# Patient Record
Sex: Female | Born: 1945 | Race: White | Hispanic: No | Smoking: Former smoker
Health system: Southern US, Community
[De-identification: ages and names within clinical notes are randomized; demographics above are authoritative.]

## PROBLEM LIST (undated history)

## (undated) DIAGNOSIS — Z8619 Personal history of other infectious and parasitic diseases: Secondary | ICD-10-CM

## (undated) DIAGNOSIS — K5792 Diverticulitis of intestine, part unspecified, without perforation or abscess without bleeding: Secondary | ICD-10-CM

## (undated) DIAGNOSIS — K219 Gastro-esophageal reflux disease without esophagitis: Secondary | ICD-10-CM

## (undated) DIAGNOSIS — F419 Anxiety disorder, unspecified: Secondary | ICD-10-CM

## (undated) DIAGNOSIS — E785 Hyperlipidemia, unspecified: Secondary | ICD-10-CM

## (undated) DIAGNOSIS — D3000 Benign neoplasm of unspecified kidney: Secondary | ICD-10-CM

## (undated) DIAGNOSIS — F32A Depression, unspecified: Secondary | ICD-10-CM

## (undated) DIAGNOSIS — F329 Major depressive disorder, single episode, unspecified: Secondary | ICD-10-CM

## (undated) DIAGNOSIS — R011 Cardiac murmur, unspecified: Secondary | ICD-10-CM

## (undated) DIAGNOSIS — D649 Anemia, unspecified: Secondary | ICD-10-CM

## (undated) DIAGNOSIS — I10 Essential (primary) hypertension: Secondary | ICD-10-CM

## (undated) HISTORY — DX: Anemia, unspecified: D64.9

## (undated) HISTORY — DX: Essential (primary) hypertension: I10

## (undated) HISTORY — DX: Personal history of other infectious and parasitic diseases: Z86.19

## (undated) HISTORY — DX: Gastro-esophageal reflux disease without esophagitis: K21.9

## (undated) HISTORY — DX: Anxiety disorder, unspecified: F41.9

## (undated) HISTORY — DX: Diverticulitis of intestine, part unspecified, without perforation or abscess without bleeding: K57.92

## (undated) HISTORY — DX: Hyperlipidemia, unspecified: E78.5

## (undated) HISTORY — DX: Depression, unspecified: F32.A

## (undated) HISTORY — DX: Cardiac murmur, unspecified: R01.1

---
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
## 1990-11-12 HISTORY — PX: ABDOMINAL HYSTERECTOMY: SHX81

## 1998-11-12 HISTORY — PX: CHOLECYSTECTOMY: SHX55

## 2016-12-20 DIAGNOSIS — H353130 Nonexudative age-related macular degeneration, bilateral, stage unspecified: Secondary | ICD-10-CM | POA: Diagnosis not present

## 2016-12-20 DIAGNOSIS — H5213 Myopia, bilateral: Secondary | ICD-10-CM | POA: Diagnosis not present

## 2016-12-20 DIAGNOSIS — H2513 Age-related nuclear cataract, bilateral: Secondary | ICD-10-CM | POA: Diagnosis not present

## 2016-12-20 DIAGNOSIS — H524 Presbyopia: Secondary | ICD-10-CM | POA: Diagnosis not present

## 2017-01-04 DIAGNOSIS — I10 Essential (primary) hypertension: Secondary | ICD-10-CM | POA: Diagnosis not present

## 2017-01-04 DIAGNOSIS — D72829 Elevated white blood cell count, unspecified: Secondary | ICD-10-CM | POA: Diagnosis not present

## 2017-01-04 DIAGNOSIS — E7800 Pure hypercholesterolemia, unspecified: Secondary | ICD-10-CM | POA: Diagnosis not present

## 2017-01-04 LAB — HEMOGLOBIN A1C: Hemoglobin A1C: 5.8

## 2017-01-04 LAB — TSH: TSH: 1.62 (ref <=5.90)

## 2017-01-04 LAB — CBC AND DIFFERENTIAL: WBC: 11.4

## 2017-01-11 DIAGNOSIS — D72829 Elevated white blood cell count, unspecified: Secondary | ICD-10-CM | POA: Diagnosis not present

## 2017-01-11 DIAGNOSIS — Z1389 Encounter for screening for other disorder: Secondary | ICD-10-CM | POA: Diagnosis not present

## 2017-01-11 DIAGNOSIS — Z90710 Acquired absence of both cervix and uterus: Secondary | ICD-10-CM | POA: Diagnosis not present

## 2017-01-11 DIAGNOSIS — Z1211 Encounter for screening for malignant neoplasm of colon: Secondary | ICD-10-CM | POA: Diagnosis not present

## 2017-01-11 DIAGNOSIS — I10 Essential (primary) hypertension: Secondary | ICD-10-CM | POA: Diagnosis not present

## 2017-01-11 DIAGNOSIS — I6523 Occlusion and stenosis of bilateral carotid arteries: Secondary | ICD-10-CM | POA: Diagnosis not present

## 2017-01-11 DIAGNOSIS — F17290 Nicotine dependence, other tobacco product, uncomplicated: Secondary | ICD-10-CM | POA: Diagnosis not present

## 2017-01-11 DIAGNOSIS — Z1231 Encounter for screening mammogram for malignant neoplasm of breast: Secondary | ICD-10-CM | POA: Diagnosis not present

## 2017-01-11 DIAGNOSIS — E559 Vitamin D deficiency, unspecified: Secondary | ICD-10-CM | POA: Diagnosis not present

## 2017-01-11 DIAGNOSIS — M8589 Other specified disorders of bone density and structure, multiple sites: Secondary | ICD-10-CM | POA: Diagnosis not present

## 2017-01-11 DIAGNOSIS — E7800 Pure hypercholesterolemia, unspecified: Secondary | ICD-10-CM | POA: Diagnosis not present

## 2017-01-11 DIAGNOSIS — Z0000 Encounter for general adult medical examination without abnormal findings: Secondary | ICD-10-CM | POA: Diagnosis not present

## 2017-02-05 DIAGNOSIS — Z1211 Encounter for screening for malignant neoplasm of colon: Secondary | ICD-10-CM | POA: Diagnosis not present

## 2017-02-08 DIAGNOSIS — H353130 Nonexudative age-related macular degeneration, bilateral, stage unspecified: Secondary | ICD-10-CM | POA: Diagnosis not present

## 2017-02-08 DIAGNOSIS — H2513 Age-related nuclear cataract, bilateral: Secondary | ICD-10-CM | POA: Diagnosis not present

## 2017-04-15 DIAGNOSIS — Z1231 Encounter for screening mammogram for malignant neoplasm of breast: Secondary | ICD-10-CM | POA: Diagnosis not present

## 2017-04-15 LAB — HM MAMMOGRAPHY

## 2017-04-26 DIAGNOSIS — M545 Low back pain: Secondary | ICD-10-CM | POA: Diagnosis not present

## 2017-04-26 DIAGNOSIS — I10 Essential (primary) hypertension: Secondary | ICD-10-CM | POA: Diagnosis not present

## 2017-05-07 DIAGNOSIS — M545 Low back pain: Secondary | ICD-10-CM | POA: Diagnosis not present

## 2017-05-10 DIAGNOSIS — M47816 Spondylosis without myelopathy or radiculopathy, lumbar region: Secondary | ICD-10-CM | POA: Diagnosis not present

## 2017-05-21 DIAGNOSIS — M545 Low back pain: Secondary | ICD-10-CM | POA: Diagnosis not present

## 2017-06-04 DIAGNOSIS — M791 Myalgia: Secondary | ICD-10-CM | POA: Diagnosis not present

## 2017-06-06 DIAGNOSIS — M791 Myalgia: Secondary | ICD-10-CM | POA: Diagnosis not present

## 2017-06-11 DIAGNOSIS — M791 Myalgia: Secondary | ICD-10-CM | POA: Diagnosis not present

## 2017-06-13 DIAGNOSIS — M791 Myalgia: Secondary | ICD-10-CM | POA: Diagnosis not present

## 2017-06-18 DIAGNOSIS — M791 Myalgia: Secondary | ICD-10-CM | POA: Diagnosis not present

## 2017-06-20 DIAGNOSIS — M791 Myalgia: Secondary | ICD-10-CM | POA: Diagnosis not present

## 2017-06-25 DIAGNOSIS — M791 Myalgia: Secondary | ICD-10-CM | POA: Diagnosis not present

## 2017-06-27 DIAGNOSIS — M791 Myalgia: Secondary | ICD-10-CM | POA: Diagnosis not present

## 2017-07-02 DIAGNOSIS — M545 Low back pain: Secondary | ICD-10-CM | POA: Diagnosis not present

## 2017-07-02 DIAGNOSIS — M791 Myalgia: Secondary | ICD-10-CM | POA: Diagnosis not present

## 2017-07-19 DIAGNOSIS — E7800 Pure hypercholesterolemia, unspecified: Secondary | ICD-10-CM | POA: Diagnosis not present

## 2017-07-19 DIAGNOSIS — I10 Essential (primary) hypertension: Secondary | ICD-10-CM | POA: Diagnosis not present

## 2017-07-26 DIAGNOSIS — E559 Vitamin D deficiency, unspecified: Secondary | ICD-10-CM | POA: Diagnosis not present

## 2017-07-26 DIAGNOSIS — I10 Essential (primary) hypertension: Secondary | ICD-10-CM | POA: Diagnosis not present

## 2017-07-26 DIAGNOSIS — M549 Dorsalgia, unspecified: Secondary | ICD-10-CM | POA: Diagnosis not present

## 2017-07-26 DIAGNOSIS — I6523 Occlusion and stenosis of bilateral carotid arteries: Secondary | ICD-10-CM | POA: Diagnosis not present

## 2017-07-26 DIAGNOSIS — E7800 Pure hypercholesterolemia, unspecified: Secondary | ICD-10-CM | POA: Diagnosis not present

## 2017-07-26 DIAGNOSIS — F17200 Nicotine dependence, unspecified, uncomplicated: Secondary | ICD-10-CM | POA: Diagnosis not present

## 2017-07-26 DIAGNOSIS — M8589 Other specified disorders of bone density and structure, multiple sites: Secondary | ICD-10-CM | POA: Diagnosis not present

## 2017-07-26 DIAGNOSIS — D72829 Elevated white blood cell count, unspecified: Secondary | ICD-10-CM | POA: Diagnosis not present

## 2017-07-26 DIAGNOSIS — Z23 Encounter for immunization: Secondary | ICD-10-CM | POA: Diagnosis not present

## 2017-08-12 DIAGNOSIS — H43392 Other vitreous opacities, left eye: Secondary | ICD-10-CM | POA: Diagnosis not present

## 2017-08-12 DIAGNOSIS — H2513 Age-related nuclear cataract, bilateral: Secondary | ICD-10-CM | POA: Diagnosis not present

## 2017-08-12 DIAGNOSIS — H35363 Drusen (degenerative) of macula, bilateral: Secondary | ICD-10-CM | POA: Diagnosis not present

## 2017-08-12 DIAGNOSIS — H43811 Vitreous degeneration, right eye: Secondary | ICD-10-CM | POA: Diagnosis not present

## 2017-08-12 DIAGNOSIS — H33101 Unspecified retinoschisis, right eye: Secondary | ICD-10-CM | POA: Diagnosis not present

## 2017-08-12 DIAGNOSIS — H353130 Nonexudative age-related macular degeneration, bilateral, stage unspecified: Secondary | ICD-10-CM | POA: Diagnosis not present

## 2018-01-08 DIAGNOSIS — Z1159 Encounter for screening for other viral diseases: Secondary | ICD-10-CM | POA: Diagnosis not present

## 2018-01-08 DIAGNOSIS — D649 Anemia, unspecified: Secondary | ICD-10-CM | POA: Diagnosis not present

## 2018-01-08 DIAGNOSIS — E7800 Pure hypercholesterolemia, unspecified: Secondary | ICD-10-CM | POA: Diagnosis not present

## 2018-01-08 DIAGNOSIS — R7301 Impaired fasting glucose: Secondary | ICD-10-CM | POA: Diagnosis not present

## 2018-01-08 DIAGNOSIS — I10 Essential (primary) hypertension: Secondary | ICD-10-CM | POA: Diagnosis not present

## 2018-01-08 LAB — IRON,TIBC AND FERRITIN PANEL
Ferritin: 146.1
Iron: 96
TIBC: 327

## 2018-01-08 LAB — TSH: TSH: 1.99 (ref <=5.90)

## 2018-01-08 LAB — CBC AND DIFFERENTIAL
HEMATOCRIT: 39 (ref 36–46)
Hemoglobin: 12.9 (ref 12.0–16.0)
PLATELETS: 284 (ref 150–399)
WBC: 9.7

## 2018-01-08 LAB — BASIC METABOLIC PANEL
GLUCOSE: 102
POTASSIUM: 4.8 (ref 3.4–5.3)
Sodium: 141 (ref 137–147)

## 2018-01-08 LAB — LIPID PANEL
CHOLESTEROL: 171 (ref 0–200)
HDL: 40 (ref 35–70)
LDL Cholesterol: 109
TRIGLYCERIDES: 111 (ref 40–160)

## 2018-01-08 LAB — VITAMIN B12: Vitamin B-12: 962

## 2018-01-08 LAB — MICROALBUMIN, URINE: Microalb, Ur: <1.2

## 2018-01-08 LAB — HEPATIC FUNCTION PANEL: Alkaline Phosphatase: 207 — AB (ref 25–125)

## 2018-01-15 DIAGNOSIS — R8290 Unspecified abnormal findings in urine: Secondary | ICD-10-CM | POA: Diagnosis not present

## 2018-01-15 DIAGNOSIS — M8589 Other specified disorders of bone density and structure, multiple sites: Secondary | ICD-10-CM | POA: Diagnosis not present

## 2018-01-15 DIAGNOSIS — D72829 Elevated white blood cell count, unspecified: Secondary | ICD-10-CM | POA: Diagnosis not present

## 2018-01-15 DIAGNOSIS — I10 Essential (primary) hypertension: Secondary | ICD-10-CM | POA: Diagnosis not present

## 2018-01-15 DIAGNOSIS — Z0000 Encounter for general adult medical examination without abnormal findings: Secondary | ICD-10-CM | POA: Diagnosis not present

## 2018-01-15 DIAGNOSIS — E7800 Pure hypercholesterolemia, unspecified: Secondary | ICD-10-CM | POA: Diagnosis not present

## 2018-01-15 DIAGNOSIS — F17200 Nicotine dependence, unspecified, uncomplicated: Secondary | ICD-10-CM | POA: Diagnosis not present

## 2018-01-15 DIAGNOSIS — Z90710 Acquired absence of both cervix and uterus: Secondary | ICD-10-CM | POA: Diagnosis not present

## 2018-01-15 DIAGNOSIS — Z1231 Encounter for screening mammogram for malignant neoplasm of breast: Secondary | ICD-10-CM | POA: Diagnosis not present

## 2018-01-15 DIAGNOSIS — E559 Vitamin D deficiency, unspecified: Secondary | ICD-10-CM | POA: Diagnosis not present

## 2018-01-15 DIAGNOSIS — I6523 Occlusion and stenosis of bilateral carotid arteries: Secondary | ICD-10-CM | POA: Diagnosis not present

## 2018-01-15 LAB — MICROALBUMIN, URINE: Microalb, Ur: 10.2

## 2018-02-12 DIAGNOSIS — R8290 Unspecified abnormal findings in urine: Secondary | ICD-10-CM | POA: Diagnosis not present

## 2018-02-17 DIAGNOSIS — H43811 Vitreous degeneration, right eye: Secondary | ICD-10-CM | POA: Diagnosis not present

## 2018-02-17 DIAGNOSIS — H353130 Nonexudative age-related macular degeneration, bilateral, stage unspecified: Secondary | ICD-10-CM | POA: Diagnosis not present

## 2018-02-17 DIAGNOSIS — H43392 Other vitreous opacities, left eye: Secondary | ICD-10-CM | POA: Diagnosis not present

## 2018-02-17 DIAGNOSIS — H2513 Age-related nuclear cataract, bilateral: Secondary | ICD-10-CM | POA: Diagnosis not present

## 2018-05-19 DIAGNOSIS — I10 Essential (primary) hypertension: Secondary | ICD-10-CM | POA: Diagnosis not present

## 2018-05-19 DIAGNOSIS — E785 Hyperlipidemia, unspecified: Secondary | ICD-10-CM | POA: Diagnosis not present

## 2018-07-18 DIAGNOSIS — E7800 Pure hypercholesterolemia, unspecified: Secondary | ICD-10-CM | POA: Diagnosis not present

## 2018-07-25 DIAGNOSIS — Z23 Encounter for immunization: Secondary | ICD-10-CM | POA: Diagnosis not present

## 2018-07-25 DIAGNOSIS — E559 Vitamin D deficiency, unspecified: Secondary | ICD-10-CM | POA: Diagnosis not present

## 2018-07-25 DIAGNOSIS — F419 Anxiety disorder, unspecified: Secondary | ICD-10-CM | POA: Diagnosis not present

## 2018-07-25 DIAGNOSIS — K219 Gastro-esophageal reflux disease without esophagitis: Secondary | ICD-10-CM | POA: Diagnosis not present

## 2018-07-25 DIAGNOSIS — M8589 Other specified disorders of bone density and structure, multiple sites: Secondary | ICD-10-CM | POA: Diagnosis not present

## 2018-07-25 DIAGNOSIS — I6523 Occlusion and stenosis of bilateral carotid arteries: Secondary | ICD-10-CM | POA: Diagnosis not present

## 2018-07-25 DIAGNOSIS — I10 Essential (primary) hypertension: Secondary | ICD-10-CM | POA: Diagnosis not present

## 2018-07-25 DIAGNOSIS — F17200 Nicotine dependence, unspecified, uncomplicated: Secondary | ICD-10-CM | POA: Diagnosis not present

## 2018-07-25 DIAGNOSIS — E786 Lipoprotein deficiency: Secondary | ICD-10-CM | POA: Diagnosis not present

## 2018-07-25 DIAGNOSIS — R899 Unspecified abnormal finding in specimens from other organs, systems and tissues: Secondary | ICD-10-CM | POA: Diagnosis not present

## 2018-07-25 DIAGNOSIS — E7800 Pure hypercholesterolemia, unspecified: Secondary | ICD-10-CM | POA: Diagnosis not present

## 2018-07-25 DIAGNOSIS — Z90710 Acquired absence of both cervix and uterus: Secondary | ICD-10-CM | POA: Diagnosis not present

## 2018-07-25 DIAGNOSIS — F4321 Adjustment disorder with depressed mood: Secondary | ICD-10-CM | POA: Diagnosis not present

## 2018-09-03 ENCOUNTER — Encounter: Payer: Self-pay | Admitting: Family Medicine

## 2018-09-03 ENCOUNTER — Ambulatory Visit (INDEPENDENT_AMBULATORY_CARE_PROVIDER_SITE_OTHER): Payer: Medicare Other | Admitting: Family Medicine

## 2018-09-03 ENCOUNTER — Other Ambulatory Visit: Payer: Self-pay

## 2018-09-03 VITALS — BP 136/78 | HR 78 | Temp 97.8°F | Ht 59.0 in | Wt 128.6 lb

## 2018-09-03 DIAGNOSIS — R011 Cardiac murmur, unspecified: Secondary | ICD-10-CM | POA: Insufficient documentation

## 2018-09-03 DIAGNOSIS — R748 Abnormal levels of other serum enzymes: Secondary | ICD-10-CM | POA: Diagnosis not present

## 2018-09-03 DIAGNOSIS — E782 Mixed hyperlipidemia: Secondary | ICD-10-CM

## 2018-09-03 DIAGNOSIS — I10 Essential (primary) hypertension: Secondary | ICD-10-CM | POA: Diagnosis not present

## 2018-09-03 DIAGNOSIS — K219 Gastro-esophageal reflux disease without esophagitis: Secondary | ICD-10-CM | POA: Insufficient documentation

## 2018-09-03 DIAGNOSIS — Z0000 Encounter for general adult medical examination without abnormal findings: Secondary | ICD-10-CM

## 2018-09-03 DIAGNOSIS — E785 Hyperlipidemia, unspecified: Secondary | ICD-10-CM | POA: Insufficient documentation

## 2018-09-03 NOTE — Patient Instructions (Signed)
Come back for labs only in December.

## 2018-09-03 NOTE — Progress Notes (Signed)
Phone: [phone]  Subjective:  Patient presents today for their Medicare Exam, establish care and follow up of her medical problems.   1) Hypertension: Here for follow up of hypertension.  Currently on lisinopril 30mg  .  Does not take home readings. Takes medication as prescribed and denies any side effects. Exercise includes walking. Weight has been stable. Denies any chest pain, headaches, shortness of breath, vision changes, swelling in lower extremities. She had labs done in September at her previous doctor's office and reviewed (cmp/lipid). Does not need refill of her medication.   2) hyperlipidemia: currently on 10mg  of crestor. She has no history of CAD/heart disease. She was on pravastatin previously. Her father had a MI at age 50 years. Mom did not have any heart issues. She has no complaints. She could not tolerate the 20mg  of the crestor, so is down to 10mg . No refills needed at this time. Lipid panel just done 07/2018 and to goal except HDL of 39.   3) GERD: currently on 40mg  of omeprazole. She has been on this for a long time. Symptoms are well controlled on it.   Preventive Screening-Counseling & Management  No exam data present  Advanced directives: No  Smoking Status: Current every day smoker- 1/2 PPD.  Second Hand Smoking status: (+) smokers in home  Risk Factors Regular exercise: walking Diet: Not good Fall Risk: None Fall Risk  09/03/2018  Falls in the past year? No   Opioid use history: no long term opioids use  Cardiac risk factors:  advanced age (older than 3 for men, 9 for women) Hyperlipidemia yes No diabetes. no Family History: yes, in her father but at age 71 years.   Depression Screen None. PHQ2 0  Depression screen [facility] 2/9 09/03/2018 09/03/2018  Decreased Interest 0 0  Down, Depressed, Hopeless 0 0  PHQ - 2 Score 0 0  Altered sleeping 0 -  Tired, decreased energy 0 -  Change in appetite 0 -  Feeling bad or failure about yourself  0 -    Trouble concentrating 0 -  Moving slowly or fidgety/restless 0 -  Suicidal thoughts 0 -  PHQ-9 Score 0 -  Difficult doing work/chores Not difficult at all -    Activities of Daily Living Independent ADLs and IADLs   Hearing Difficulties: -patient declines  Cognitive Testing No reported trouble.    Normal 3 word recall Mini-cog: 5/5  List the Names of Other Physician/Practitioners you currently use: - -   There is no immunization history on file for this patient. Required Immunizations needed today already had flu shot. Will wait for records.   Screening tests- up to date Health Maintenance Due  Topic Date Due  . Hepatitis C Screening  [birth year]  . TETANUS/TDAP  06/28/[birth year]  . DEXA SCAN  06/28/[birth year]  . PNA vac Low Risk Adult (1 of 2 - PCV13) 06/28/71     Review of Systems  Constitutional: Negative for chills, fever and malaise/fatigue.  HENT: Negative for hearing loss and sore throat.   Eyes: Negative for blurred vision and double vision.  Respiratory: Negative for cough, shortness of breath and wheezing.   Cardiovascular: Negative for chest pain, palpitations and leg swelling.  Gastrointestinal: Negative for abdominal pain, blood in stool, nausea and vomiting.  Genitourinary: Negative for dysuria and hematuria.  Musculoskeletal: Negative for falls.  Skin: Negative for rash.  Neurological: Negative for dizziness and weakness.  Psychiatric/Behavioral: Negative for memory loss and suicidal ideas. The patient is not nervous/anxious and does  not have insomnia.      The following were reviewed and entered/updated in epic: Past Medical History:  Diagnosis Date  . Anxiety   . Diverticulitis   . GERD (gastroesophageal reflux disease)   . Heart murmur   . History of chicken pox   . Hyperlipidemia   . Hypertension    Patient Active Problem List   Diagnosis Date Noted  . Benign essential HTN 09/03/2018  . GERD (gastroesophageal reflux disease)  09/03/2018  . Hyperlipidemia 09/03/2018  . Heart murmur 09/03/2018   Past Surgical History:  Procedure Laterality Date  . ABDOMINAL HYSTERECTOMY  1992  . APPENDECTOMY  1961  . CHOLECYSTECTOMY  2000  . TONSILLECTOMY AND ADENOIDECTOMY  1959    Family History  Problem Relation Age of Onset  . COPD Mother   . Heart attack Father   . Stroke Brother     Medications- reviewed and updated Current Outpatient Medications  Medication Sig Dispense Refill  . aspirin EC 81 MG tablet Take 81 mg by mouth daily.    . rosuvastatin (CRESTOR) 10 MG tablet Take 10 mg by mouth daily.    Marland Kitchen ALPRAZolam (XANAX) 0.25 MG tablet     . lisinopril (PRINIVIL,ZESTRIL) 30 MG tablet     . omeprazole (PRILOSEC) 40 MG capsule      No current facility-administered medications for this visit.     Allergies-reviewed and updated Allergies  Allergen Reactions  . Atorvastatin Hives  . Lescol [Fluvastatin] Hives    Social History   Socioeconomic History  . Marital status: Widowed    Spouse name: Not on file  . Number of children: Not on file  . Years of education: Not on file  . Highest education level: Not on file  Occupational History  . Not on file  Social Needs  . Financial resource strain: Not on file  . Food insecurity:    Worry: Not on file    Inability: Not on file  . Transportation needs:    Medical: Not on file    Non-medical: Not on file  Tobacco Use  . Smoking status: Current Every Day Smoker    Types: Cigarettes  . Smokeless tobacco: Never Used  Substance and Sexual Activity  . Alcohol use: Not Currently  . Drug use: Never  . Sexual activity: Not Currently  Lifestyle  . Physical activity:    Days per week: Not on file    Minutes per session: Not on file  . Stress: Not on file  Relationships  . Social connections:    Talks on phone: Not on file    Gets together: Not on file    Attends religious service: Not on file    Active member of club or organization: Not on file     Attends meetings of clubs or organizations: Not on file    Relationship status: Not on file  Other Topics Concern  . Not on file  Social History Narrative  . Not on file    Objective: BP 136/78 (BP Location: Left Arm, Patient Position: Sitting, Cuff Size: Normal)   Pulse 78   Temp 97.8 F (36.6 C) (Oral)   Ht 4\' 11"  (1.499 m)   Wt 128 lb 9.6 oz (58.3 kg)   LMP  (LMP Unknown)   SpO2 96%   BMI 25.97 kg/m  Gen: NAD, resting comfortably HEENT: Mucous membranes are moist. Oropharynx normal Neck: no thyromegaly CV: RRR no murmurs rubs or gallops Lungs: CTAB no crackles, wheeze, rhonchi Abdomen:  soft/nontender/nondistended/normal bowel sounds. No rebound or guarding.  Ext: no edema Skin: warm, dry Neuro: grossly normal, moves all extremities, PERRLA  Assessment/Plan: Medicare exam completed- discussed recommended screenings anddocumented any personalized health advice and referrals for preventive counseling. See AVS as well which was given to patient.   Status of chronic or acute concerns    Future Appointments  Date Time Provider [place]  10/13/2018 10:45 AM [facility]  03/02/2019 11:00 AM Orma Flaming, MD [facility]   Return in about 6 months (around 03/05/2019) for regular check up .   Lab/Order associations: Medicare annual wellness visit, subsequent  Benign essential HTN -Blood pressure is to goal. Continue current anti-hypertensive medications. No Refills needed. Recommended routine exercise and healthy diet including DASH diet and mediterranean diet. Encouraged weight loss. F/u in 6 months. Reviewed labs and requesting records as well.   Gastroesophageal reflux disease, esophagitis presence not specified -well controlled on her medication. No refills needed. Has been on this a long time.   -hyperlipidemia Well controlled on crestor 10mg . Labs reviewed. Continue medication. No refills needed at this time. Will repeat lipid panel in 07/2019.    Elevated alkaline phosphatase level - Plan: Comprehensive metabolic panel, VITAMIN D 25 Hydroxy (Vit-D Deficiency, Fractures), CBC with Differential/Platelet, Gamma GT -will come back for labs. Future ordered these.   No orders of the defined types were placed in this encounter.  -requesting records for her health maintenance/previous notes. -mmg info given.   Return precautions advised. Orma Flaming, MD

## 2018-09-11 LAB — CHG X-RAY LUMBAR SPINE 2/3 VW

## 2018-09-11 LAB — MR OUTSIDE FILMS SPINE

## 2018-09-11 LAB — FOLATE: .: >20

## 2018-09-25 ENCOUNTER — Other Ambulatory Visit: Payer: Self-pay

## 2018-09-25 ENCOUNTER — Other Ambulatory Visit: Payer: Self-pay | Admitting: Family Medicine

## 2018-09-25 DIAGNOSIS — Z1231 Encounter for screening mammogram for malignant neoplasm of breast: Secondary | ICD-10-CM

## 2018-09-29 ENCOUNTER — Ambulatory Visit
Admission: RE | Admit: 2018-09-29 | Discharge: 2018-09-29 | Disposition: A | Discharge status: Home / Self Care | Payer: Medicare Other | Source: Ambulatory Visit | Attending: Family Medicine | Admitting: Family Medicine

## 2018-09-29 DIAGNOSIS — Z1231 Encounter for screening mammogram for malignant neoplasm of breast: Secondary | ICD-10-CM | POA: Diagnosis not present

## 2018-10-03 ENCOUNTER — Encounter: Payer: Self-pay | Admitting: Family Medicine

## 2018-10-03 DIAGNOSIS — E538 Deficiency of other specified B group vitamins: Secondary | ICD-10-CM | POA: Insufficient documentation

## 2018-10-03 DIAGNOSIS — M8580 Other specified disorders of bone density and structure, unspecified site: Secondary | ICD-10-CM | POA: Insufficient documentation

## 2018-10-03 DIAGNOSIS — Z862 Personal history of diseases of the blood and blood-forming organs and certain disorders involving the immune mechanism: Secondary | ICD-10-CM | POA: Insufficient documentation

## 2018-10-03 DIAGNOSIS — D649 Anemia, unspecified: Secondary | ICD-10-CM | POA: Insufficient documentation

## 2018-10-03 DIAGNOSIS — E559 Vitamin D deficiency, unspecified: Secondary | ICD-10-CM | POA: Insufficient documentation

## 2018-10-13 ENCOUNTER — Telehealth: Payer: Self-pay | Admitting: Family Medicine

## 2018-10-13 ENCOUNTER — Other Ambulatory Visit (INDEPENDENT_AMBULATORY_CARE_PROVIDER_SITE_OTHER): Payer: Medicare Other

## 2018-10-13 ENCOUNTER — Other Ambulatory Visit: Payer: Self-pay

## 2018-10-13 ENCOUNTER — Other Ambulatory Visit: Payer: Self-pay | Admitting: Family Medicine

## 2018-10-13 DIAGNOSIS — R748 Abnormal levels of other serum enzymes: Secondary | ICD-10-CM

## 2018-10-13 LAB — COMPREHENSIVE METABOLIC PANEL
ALK PHOS: 198 U/L — AB (ref 39–117)
ALT: 16 U/L (ref 0–35)
AST: 16 U/L (ref 0–37)
Albumin: 4.5 g/dL (ref 3.5–5.2)
BUN: 24 mg/dL — ABNORMAL HIGH (ref 6–23)
CO2: 23 mEq/L (ref 19–32)
Calcium: 9.9 mg/dL (ref 8.4–10.5)
Chloride: 105 mEq/L (ref 96–112)
Creatinine, Ser: 1.03 mg/dL (ref 0.40–1.20)
GFR: 55.94 mL/min — ABNORMAL LOW (ref >60.00)
Glucose, Bld: 114 mg/dL — ABNORMAL HIGH (ref 70–99)
Potassium: 4.3 mEq/L (ref 3.5–5.1)
Sodium: 139 mEq/L (ref 135–145)
Total Bilirubin: 0.4 mg/dL (ref 0.2–1.2)
Total Protein: 6.7 g/dL (ref 6.0–8.3)

## 2018-10-13 LAB — CBC WITH DIFFERENTIAL/PLATELET
Basophils Absolute: 0.1 10*3/uL (ref 0.0–0.1)
Basophils Relative: 0.6 % (ref 0.0–3.0)
Eosinophils Absolute: 0.3 10*3/uL (ref 0.0–0.7)
Eosinophils Relative: 2.7 % (ref 0.0–5.0)
HCT: 33.9 % — ABNORMAL LOW (ref 36.0–46.0)
HEMOGLOBIN: 11.5 g/dL — AB (ref 12.0–15.0)
Lymphocytes Relative: 49.1 % — ABNORMAL HIGH (ref 12.0–46.0)
Lymphs Abs: 5 10*3/uL — ABNORMAL HIGH (ref 0.7–4.0)
MCHC: 34 g/dL (ref 30.0–36.0)
MCV: 89.5 fl (ref 78.0–100.0)
MONO ABS: 0.6 10*3/uL (ref 0.1–1.0)
Monocytes Relative: 5.9 % (ref 3.0–12.0)
Neutro Abs: 4.2 10*3/uL (ref 1.4–7.7)
Neutrophils Relative %: 41.7 % — ABNORMAL LOW (ref 43.0–77.0)
Platelets: 320 10*3/uL (ref 150.0–400.0)
RBC: 3.79 Mil/uL — ABNORMAL LOW (ref 3.87–5.11)
RDW: 13.6 % (ref 11.5–15.5)
WBC: 10.1 10*3/uL (ref 4.0–10.5)

## 2018-10-13 LAB — GAMMA GT: GGT: 20 U/L (ref 7–51)

## 2018-10-13 LAB — VITAMIN D 25 HYDROXY (VIT D DEFICIENCY, FRACTURES): VITD: 38.06 ng/mL (ref 30.00–100.00)

## 2018-10-13 MED ORDER — LISINOPRIL 30 MG PO TABS: 30.0000 mg | ORAL_TABLET | Freq: Every day | ORAL | 1 refills | Status: DC

## 2018-10-13 NOTE — Addendum Note (Signed)
Addended by: Kayren Eaves T on: 10/13/2018 12:23 PM   Modules accepted: Orders

## 2018-10-13 NOTE — Telephone Encounter (Signed)
MEDICATION:  lisinopril (PRINIVIL,ZESTRIL) 30 MG tablet    omeprazole (PRILOSEC) 40 MG capsule    PHARMACY:   [id number] - [place], [place] - [address] [phone] (Phone) [phone] (Fax)     IS THIS A 90 DAY SUPPLY : yes  IS PATIENT OUT OF MEDICATION: no  IF NOT; HOW MUCH IS LEFT: a week  LAST APPOINTMENT DATE: @10 /31/2019  NEXT APPOINTMENT DATE:@12 /12/2017  OTHER COMMENTS:    **Let patient know to contact pharmacy at the end of the day to make sure medication is ready. **  ** Please notify patient to allow 48-72 hours to process**  **Encourage patient to contact the pharmacy for refills or they can request refills through [facility]**

## 2018-10-15 ENCOUNTER — Encounter: Payer: Self-pay | Admitting: Family Medicine

## 2018-10-15 DIAGNOSIS — R748 Abnormal levels of other serum enzymes: Secondary | ICD-10-CM | POA: Insufficient documentation

## 2019-02-26 ENCOUNTER — Other Ambulatory Visit: Payer: Self-pay

## 2019-02-26 ENCOUNTER — Telehealth: Payer: Self-pay | Admitting: Family Medicine

## 2019-02-26 ENCOUNTER — Ambulatory Visit (INDEPENDENT_AMBULATORY_CARE_PROVIDER_SITE_OTHER): Payer: Medicare Other | Admitting: Family Medicine

## 2019-02-26 ENCOUNTER — Encounter: Payer: Self-pay | Admitting: Family Medicine

## 2019-02-26 VITALS — Temp 97.0°F | Ht 59.0 in | Wt 132.0 lb

## 2019-02-26 DIAGNOSIS — R748 Abnormal levels of other serum enzymes: Secondary | ICD-10-CM | POA: Diagnosis not present

## 2019-02-26 DIAGNOSIS — F418 Other specified anxiety disorders: Secondary | ICD-10-CM

## 2019-02-26 DIAGNOSIS — E782 Mixed hyperlipidemia: Secondary | ICD-10-CM | POA: Diagnosis not present

## 2019-02-26 MED ORDER — PRAVASTATIN SODIUM 40 MG PO TABS: 40.0000 mg | ORAL_TABLET | Freq: Every day | ORAL | 3 refills | Status: DC

## 2019-02-26 NOTE — Telephone Encounter (Signed)
See note

## 2019-02-26 NOTE — Telephone Encounter (Signed)
Doxy.me visit scheduled for 4/16

## 2019-02-26 NOTE — Progress Notes (Signed)
Patient: Latoya Mcfarland MRN: [id number] DOB: [birth year] PCP: Orma Flaming, MD     I connected with Latoya Mcfarland on 02/26/19 at 9:10am by a video enabled telemedicine application and verified that I am speaking with the correct person using two identifiers.  Location patient: Home Location provider: [facility], Office Persons participating in this virtual visit: Latoya Mcfarland and Dr. Rogers Blocker   I discussed the limitations of evaluation and management by telemedicine and the availability of in person appointments. The patient expressed understanding and agreed to proceed.   Subjective:  Chief Complaint  Patient presents with  . Medication Management    HPI: The patient is a 73 y.o. female who presents today for concerns for her crestor and her elevated alk phos level. She states when she was in [place] her doctor changed her from pravachol to crestor to get better control of her lipids and she believes that is when her alkaline phosphatase "sky rocketed." She asked them about this and they told her to cut her dose in half which she did. She then moved down here and after we checked her labs her alkaline phosphatase continued to be mildly elevated at 198. I did check a GGT and vitamin D which were both normal.   She is having no side effects from the statin. Taking as prescribed.   She is also having increased anxiety due to her son being an ER doctor on the front lines, but feels like she is managing just fine. Does take xanax about twice/week but for sleep. Has never taken for anxiety and doesn't feel like she needs any medication.   Review of Systems  Constitutional: Negative for fatigue.  Eyes: Negative for visual disturbance.  Respiratory: Negative for shortness of breath.   Cardiovascular: Negative.  Negative for chest pain.  Gastrointestinal: Negative for abdominal pain and nausea.  Musculoskeletal: Negative for back pain and neck pain.  Neurological: Negative for  dizziness and facial asymmetry.  Psychiatric/Behavioral: Positive for dysphoric mood and sleep disturbance. The patient is nervous/anxious.     Allergies Patient is allergic to atorvastatin and lescol [fluvastatin].  Past Medical History Patient  has a past medical history of Anxiety, Diverticulitis, GERD (gastroesophageal reflux disease), Heart murmur, History of chicken pox, Hyperlipidemia, and Hypertension.  Surgical History Patient  has a past surgical history that includes Cholecystectomy (2000); Appendectomy (1961); Abdominal hysterectomy (1992); and Tonsillectomy and adenoidectomy (1959).  Family History Pateint's family history includes COPD in her mother; Heart attack in her father; Stroke in her brother.  Social History Patient  reports that she has been smoking cigarettes. She has never used smokeless tobacco. She reports previous alcohol use. She reports that she does not use drugs.    Objective: Vitals:   02/26/19 0900  Temp: (!) 97 F (36.1 C)  TempSrc: Oral  Weight: 132 lb (59.9 kg)  Height: 4' 11"  (1.499 m)    Body mass index is 26.66 kg/m.  Physical Exam Vitals signs reviewed.  Constitutional:      Appearance: Normal appearance.  Eyes:     Extraocular Movements: Extraocular movements intact.  Pulmonary:     Effort: Pulmonary effort is normal.  Skin:    General: Skin is warm.  Neurological:     General: No focal deficit present.     Mental Status: She is alert and oriented to person, place, and time.  Psychiatric:        Mood and Affect: Mood normal.        Behavior: Behavior normal.  Office Visit from 02/26/2019 in [place]  PHQ-9 Total Score  2     GAD 7 : Generalized Anxiety Score 02/26/2019 09/03/2018  Nervous, Anxious, on Edge 1 1  Control/stop worrying 1 0  Worry too much - different things 1 0  Trouble relaxing 0 0  Restless 0 0  Easily annoyed or irritable 0 0  Afraid - awful might happen 1 1   Total GAD 7 Score 4 2  Anxiety Difficulty Not difficult at all Not difficult at all      Assessment/plan: 1. Mixed hyperlipidemia Discussed I do not think the crestor is the cause of her elevated alkaline phosphatase, especially with a normal GGT, but I could be wrong and I am fine letting her stop this and go back to her 43m of pravachol. She tolerated this fine in the past. She is due for her 6 month f/u and labs in about 5 weeks so we will recheck lipids and cmp to see if levels are improved. She is happy with this plan. Did discuss that crestor is the ideal statin as it has better lipid lowering effects on lower doses, but if this is the cause of the elevated AP, Im fine with changing her.   2. Elevated alkaline phosphatase level See above.   3. Situational anxiety -GAD7 score and phq9 score are both insignificant. Secondary to covid outbreak. Managing just fine. Continue with conservative therapy, exercise. Let me know if she feels like this is getting worse.   Return in about 5 weeks (around 04/04/2019) for routine 6 month f/u and labs .    AOrma Flaming MD [place] 02/26/2019

## 2019-02-26 NOTE — Telephone Encounter (Signed)
I would schedule doxy visit. I don't think her crestor is causing the elevation and we can talk through this.Marland Kitchen

## 2019-02-26 NOTE — Telephone Encounter (Signed)
Copied from [place] [id number]. Topic: Quick Communication - Rx Refill/Question >> Feb 26, 2019  8:17 AM Margot Ables wrote: Medication: rosuvastatin (CRESTOR) 10 MG tablet - pt asking to switch medications. She states she was on pravastatin 40mg  before. Pt said she has had high alkaline phosphates and thinks that became higher after she changed to the rosuvastatin. She said Dr. Rogers Blocker may not have the records from her previous provider in [place] (who changed her to rosuvastatin). Pt notes that at last OV she was not fasting and was not sick (see lab notes from [place] 10/2018). Pt states she has stopped taking rosuvastatin a couple of weeks ago. Please call back [phone].  Preferred Pharmacy (with phone number or street name): [facility] [place], [place] [phone] (Phone) [phone] (Fax)

## 2019-03-02 ENCOUNTER — Ambulatory Visit: Payer: Medicare Other | Admitting: Family Medicine

## 2019-04-08 ENCOUNTER — Ambulatory Visit (INDEPENDENT_AMBULATORY_CARE_PROVIDER_SITE_OTHER): Payer: Medicare Other | Admitting: Family Medicine

## 2019-04-08 ENCOUNTER — Encounter: Payer: Self-pay | Admitting: Family Medicine

## 2019-04-08 ENCOUNTER — Other Ambulatory Visit: Payer: Self-pay

## 2019-04-08 VITALS — BP 132/66 | HR 77 | Temp 98.2°F | Ht 59.0 in | Wt 132.8 lb

## 2019-04-08 DIAGNOSIS — Z1211 Encounter for screening for malignant neoplasm of colon: Secondary | ICD-10-CM

## 2019-04-08 DIAGNOSIS — E782 Mixed hyperlipidemia: Secondary | ICD-10-CM

## 2019-04-08 DIAGNOSIS — R748 Abnormal levels of other serum enzymes: Secondary | ICD-10-CM | POA: Diagnosis not present

## 2019-04-08 DIAGNOSIS — I10 Essential (primary) hypertension: Secondary | ICD-10-CM | POA: Diagnosis not present

## 2019-04-08 LAB — CBC WITH DIFFERENTIAL/PLATELET
Basophils Absolute: 0.1 10*3/uL (ref 0.0–0.1)
Basophils Relative: 1 % (ref 0.0–3.0)
Eosinophils Absolute: 0.3 10*3/uL (ref 0.0–0.7)
Eosinophils Relative: 3 % (ref 0.0–5.0)
HCT: 32.1 % — ABNORMAL LOW (ref 36.0–46.0)
Hemoglobin: 10.9 g/dL — ABNORMAL LOW (ref 12.0–15.0)
Lymphocytes Relative: 42.3 % (ref 12.0–46.0)
Lymphs Abs: 4.4 10*3/uL — ABNORMAL HIGH (ref 0.7–4.0)
MCHC: 34.1 g/dL (ref 30.0–36.0)
MCV: 87.7 fl (ref 78.0–100.0)
Monocytes Absolute: 0.5 10*3/uL (ref 0.1–1.0)
Monocytes Relative: 4.5 % (ref 3.0–12.0)
Neutro Abs: 5.1 10*3/uL (ref 1.4–7.7)
Neutrophils Relative %: 49.2 % (ref 43.0–77.0)
Platelets: 344 10*3/uL (ref 150.0–400.0)
RBC: 3.66 Mil/uL — ABNORMAL LOW (ref 3.87–5.11)
RDW: 13.5 % (ref 11.5–15.5)
WBC: 10.4 10*3/uL (ref 4.0–10.5)

## 2019-04-08 LAB — COMPREHENSIVE METABOLIC PANEL
ALT: 14 U/L (ref 0–35)
AST: 17 U/L (ref 0–37)
Albumin: 4.3 g/dL (ref 3.5–5.2)
Alkaline Phosphatase: 225 U/L — ABNORMAL HIGH (ref 39–117)
BUN: 24 mg/dL — ABNORMAL HIGH (ref 6–23)
CO2: 25 mEq/L (ref 19–32)
Calcium: 9.9 mg/dL (ref 8.4–10.5)
Chloride: 102 mEq/L (ref 96–112)
Creatinine, Ser: 1.02 mg/dL (ref 0.40–1.20)
GFR: 53.15 mL/min — ABNORMAL LOW (ref >60.00)
Glucose, Bld: 103 mg/dL — ABNORMAL HIGH (ref 70–99)
Potassium: 5.3 mEq/L — ABNORMAL HIGH (ref 3.5–5.1)
Sodium: 137 mEq/L (ref 135–145)
Total Bilirubin: 0.5 mg/dL (ref 0.2–1.2)
Total Protein: 7.1 g/dL (ref 6.0–8.3)

## 2019-04-08 LAB — LIPID PANEL
Cholesterol: 176 mg/dL (ref 0–200)
HDL: 33.3 mg/dL — ABNORMAL LOW (ref >39.00)
NonHDL: 142.85
Total CHOL/HDL Ratio: 5
Triglycerides: 211 mg/dL — ABNORMAL HIGH (ref 0.0–149.0)
VLDL: 42.2 mg/dL — ABNORMAL HIGH (ref 0.0–40.0)

## 2019-04-08 LAB — LDL CHOLESTEROL, DIRECT: Direct LDL: 107 mg/dL

## 2019-04-08 MED ORDER — ALPRAZOLAM 0.25 MG PO TABS: 0.2500 mg | ORAL_TABLET | Freq: Two times a day (BID) | ORAL | 0 refills | Status: AC | PRN

## 2019-04-08 MED ORDER — LISINOPRIL 30 MG PO TABS: 30.0000 mg | ORAL_TABLET | Freq: Every day | ORAL | 1 refills | Status: AC

## 2019-04-08 NOTE — Progress Notes (Signed)
Patient: Latoya Mcfarland MRN: [id number] DOB: [birth year] PCP: Orma Flaming, MD     Subjective:  Chief Complaint  Patient presents with  . Hypertension    HPI: The patient is a 73 y.o. female who presents today for routine HTN follow up. She is also due for lab work today.  Hypertension: Here for follow up of hypertension.  Currently on lisinopril 30mg  . Takes medication as prescribed and denies any side effects. Exercise includes walking. Weight has been stable. Denies any chest pain, headaches, shortness of breath, vision changes, swelling in lower extremities.   Elevated alkaline phosphatase: thought it was due to crestor. We are going to check on this today.  She states she has had this for a really long time and was worked up in [place]. Also had checked vitamin D and GGT and both normal. Has had negative hepatitis panel.    Review of Systems  Constitutional: Negative for fatigue and unexpected weight change.  Eyes: Negative for visual disturbance.  Respiratory: Negative for shortness of breath.   Cardiovascular: Negative for chest pain.  Gastrointestinal: Negative for abdominal pain and nausea.  Neurological: Negative for dizziness and headaches.  Psychiatric/Behavioral: Negative for sleep disturbance.    Allergies Patient is allergic to atorvastatin and lescol [fluvastatin].  Past Medical History Patient  has a past medical history of Anxiety, Diverticulitis, GERD (gastroesophageal reflux disease), Heart murmur, History of chicken pox, Hyperlipidemia, and Hypertension.  Surgical History Patient  has a past surgical history that includes Cholecystectomy (2000); Appendectomy (1961); Abdominal hysterectomy (1992); and Tonsillectomy and adenoidectomy (1959).  Family History Pateint's family history includes COPD in her mother; Heart attack in her father; Stroke in her brother.  Social History Patient  reports that she has been smoking cigarettes. She has never used smokeless  tobacco. She reports previous alcohol use. She reports that she does not use drugs.    Objective: Vitals:   04/08/19 0806  BP: 132/66  Pulse: 77  Temp: 98.2 F (36.8 C)  TempSrc: Oral  SpO2: 99%  Weight: 132 lb 12.8 oz (60.2 kg)  Height: 4\' 11"  (1.499 m)    Body mass index is 26.82 kg/m.  Physical Exam Vitals signs reviewed.  Constitutional:      Appearance: Normal appearance.  HENT:     Nose: Nose normal.  Eyes:     Extraocular Movements: Extraocular movements intact.     Pupils: Pupils are equal, round, and reactive to light.  Neck:     Musculoskeletal: Normal range of motion and neck supple.     Vascular: No carotid bruit.  Cardiovascular:     Rate and Rhythm: Normal rate and regular rhythm.     Pulses: Normal pulses.     Heart sounds: Normal heart sounds.  Pulmonary:     Effort: Pulmonary effort is normal.     Breath sounds: Normal breath sounds.  Abdominal:     General: Abdomen is flat. Bowel sounds are normal.     Palpations: Abdomen is soft.  Skin:    General: Skin is warm and dry.     Capillary Refill: Capillary refill takes less than 2 seconds.  Neurological:     General: No focal deficit present.     Mental Status: She is alert and oriented to person, place, and time.  Psychiatric:        Mood and Affect: Mood normal.        Behavior: Behavior normal.        Assessment/plan: 1. Elevated alkaline phosphatase  level Chronic issue that was worked up in [place]. She thinks due to crestor. We took her off crestor and changed her to pravachol and will check today. Vit. D normal. Hep screen negative. GGT normal as well.   2. Mixed hyperlipidemia Repeat today. On pravastatin. No refills needed.  - Lipid panel  3. Benign essential HTN Blood pressure is to goal. Continue current anti-hypertensive medications. Refills given and routine lab work will be done today. Recommended routine exercise and healthy diet including DASH diet and mediterranean diet.  Encouraged weight loss. F/u in 6 months.   - CBC with Differential/Platelet - Comprehensive metabolic panel  4. Screening for colon cancer  - Cologuard    Return in about 6 months (around 10/09/2019) for routine f/u. medicare awv .     Orma Flaming, MD [place]  04/08/2019

## 2019-04-09 ENCOUNTER — Telehealth: Payer: Self-pay | Admitting: Family Medicine

## 2019-04-09 DIAGNOSIS — R748 Abnormal levels of other serum enzymes: Secondary | ICD-10-CM

## 2019-04-09 NOTE — Telephone Encounter (Signed)
See note  Copied from [place] [id number]. Topic: General - Other >> Apr 09, 2019  3:44 PM Leward Quan A wrote: Reason for CRM: Patient called to say she is ok with getting the referral for the GI specialist, Also say she will go back to Crestor and say that she will walk more and improve her diet and also she is expecting a kit from [facility]. Ph# [phone]

## 2019-04-10 MED ORDER — ROSUVASTATIN CALCIUM 10 MG PO TABS: 10.0000 mg | ORAL_TABLET | Freq: Every day | ORAL | 3 refills | Status: DC

## 2019-04-10 NOTE — Addendum Note (Signed)
Addended by: Orma Flaming on: 04/10/2019 08:48 AM   Modules accepted: Orders

## 2019-04-10 NOTE — Telephone Encounter (Signed)
Spoke to patient and reviewed of all recommendations per Dr. Rogers Blocker and she verbalized understanding.

## 2019-04-10 NOTE — Telephone Encounter (Signed)
GI referral placed. Order placed for crestor 10mg . She is to stop the pravastatin.  Thanks!

## 2019-04-14 DIAGNOSIS — Z1211 Encounter for screening for malignant neoplasm of colon: Secondary | ICD-10-CM | POA: Diagnosis not present

## 2019-04-14 LAB — COLOGUARD

## 2019-04-17 ENCOUNTER — Telehealth: Payer: Self-pay

## 2019-04-17 NOTE — Telephone Encounter (Signed)
Covid-19 screening questions  Have you traveled in the last 14 days? No. If yes where?  Do you now or have you had a fever in the last 14 days? No.  Do you have any respiratory symptoms of shortness of breath or cough now or in the last 14 days? No.  Do you have any family members or close contacts with diagnosed or suspected Covid-19 in the past 14 days? No.  Have you been tested for Covid-19 and found to be positive? No.       

## 2019-04-20 ENCOUNTER — Other Ambulatory Visit: Payer: Self-pay

## 2019-04-20 ENCOUNTER — Other Ambulatory Visit: Payer: Medicare Other

## 2019-04-20 ENCOUNTER — Encounter: Payer: Self-pay | Admitting: Gastroenterology

## 2019-04-20 ENCOUNTER — Ambulatory Visit (INDEPENDENT_AMBULATORY_CARE_PROVIDER_SITE_OTHER): Payer: Medicare Other | Admitting: Gastroenterology

## 2019-04-20 VITALS — BP 128/60 | HR 73 | Ht 59.0 in | Wt 131.2 lb

## 2019-04-20 DIAGNOSIS — Z1211 Encounter for screening for malignant neoplasm of colon: Secondary | ICD-10-CM

## 2019-04-20 DIAGNOSIS — R748 Abnormal levels of other serum enzymes: Secondary | ICD-10-CM | POA: Diagnosis not present

## 2019-04-20 NOTE — Progress Notes (Signed)
    History of Present Illness: This is a 73 year old female referred by Orma Flaming, MD for the evaluation of a persistently elevated alkaline phosphatase.  Patient relates relocating to [place] from [place] in 2019. Her son is a Radiation protection practitioner ED physician.  She states that in the spring 2019 she was told of an elevated alkaline phosphatase and does not recall further evaluation except for discontinuing a statin drug.  Blood work information below.  She has no history of liver disease or jaundice.  No family history of liver disease.  She states she had a colonoscopy about 2011 in [place] and recalls being told of less than optimal bowel prep with a 5 year colonoscopy recommended. She related difficulties with diverticulitis following her colonoscopy.  Cologuard was recently sent and is pending. Denies weight loss, abdominal pain, constipation, diarrhea, change in stool caliber, melena, hematochezia, nausea, vomiting, dysphagia, reflux symptoms, chest pain.  Alk phos 225, 198, 207 over past 16 months GGT=20 (normal) in 10/2018 Hep C Ab 12/2017 negative   Review of Systems: Pertinent positive and negative review of systems were noted in the above HPI section. All other review of systems were otherwise negative.  Current Medications, Allergies, Past Medical History, Past Surgical History, Family History and Social History were reviewed in [employer] record.  Physical Exam: General: Well developed, well nourished, no acute distress Head: Normocephalic and atraumatic Eyes:  sclerae anicteric, EOMI Ears: Normal auditory acuity Mouth: No deformity or lesions Neck: Supple, no masses or thyromegaly Lungs: Clear throughout to auscultation Heart: Regular rate and rhythm; no murmurs, rubs or bruits Abdomen: Soft, non tender and non distended. No masses, hepatosplenomegaly or hernias noted. Normal Bowel sounds Rectal: Not done  Musculoskeletal: Symmetrical with no gross deformities  Skin: No  lesions on visible extremities Pulses:  Normal pulses noted Extremities: No clubbing, cyanosis, edema or deformities noted Neurological: Alert oriented x 4, grossly nonfocal Cervical Nodes:  No significant cervical adenopathy Inguinal Nodes: No significant inguinal adenopathy Psychological:  Alert and cooperative. Normal mood and affect   Assessment and Recommendations:  1. Persistently elevated alk phos with GGT normal and other LFTs normal.  Suspect alkaline phosphatase is not of hepatic origin.  Send alkaline phosphatase isoenzymes and 5 prime nucleotidase.  If results suggest a hepatic etiology further evaluation with serologies and hepatobiliary imaging.  If not of GI or hepatic origin return to PCP for further care.   2. CRC screening. Cologuard pending.   3. GERD.  Continue omeprazole 40 mg daily as needed.  4. History of diverticulitis.    cc: Orma Flaming, MD [address] [place], [place] [address]

## 2019-04-20 NOTE — Patient Instructions (Addendum)
Your provider has requested that you go to the basement level for lab work before leaving today. Press "B" on the elevator. The lab is located at the first door on the left as you exit the elevator.  Thank you for choosing me and [facility].  Dr. Fuller Plan

## 2019-04-22 ENCOUNTER — Other Ambulatory Visit: Payer: Self-pay

## 2019-04-22 DIAGNOSIS — R748 Abnormal levels of other serum enzymes: Secondary | ICD-10-CM

## 2019-04-22 LAB — ALKALINE PHOSPHATASE, ISOENZYMES
Alkaline Phosphatase: 234 IU/L — ABNORMAL HIGH (ref 39–117)
BONE FRACTION: 18 % (ref 14–68)
INTESTINAL FRAC.: 0 % (ref 0–18)
LIVER FRACTION: 82 % (ref 18–85)

## 2019-04-22 LAB — NUCLEOTIDASE, 5', BLOOD: 5-Nucleotidase: 3 U/L (ref 0–10)

## 2019-04-23 ENCOUNTER — Telehealth: Payer: Self-pay

## 2019-04-23 ENCOUNTER — Telehealth: Payer: Self-pay | Admitting: Family Medicine

## 2019-04-23 ENCOUNTER — Telehealth: Payer: Self-pay | Admitting: Gastroenterology

## 2019-04-23 LAB — PR FECAL DNA ANALYSIS: Cologuard: POSITIVE — AB

## 2019-04-23 NOTE — Telephone Encounter (Signed)
Received positive cologuard result for patient.  Results forwarded to Dr. Jonni Sanger for review and follow up.  Referral placed to GI for colonoscopy.

## 2019-04-23 NOTE — Telephone Encounter (Unsigned)
Copied from [place] [id number]. Topic: Quick Communication - See Telephone Encounter >> Apr 23, 2019 10:03 AM Valla Leaver wrote: CRM for notification. See Telephone encounter for: 04/23/19. [employer] rep Seth Bake, calling to notify Rogers Blocker that cologaurd test is positive for this patient. Result was abnormal and this was faxed to the office yesterday. Seth Bake wanted to confirm receipt. P: [phone]

## 2019-04-23 NOTE — Telephone Encounter (Signed)
Cologuard was pending at the time of visit.  Please schedule direct colonoscopy.

## 2019-04-23 NOTE — Telephone Encounter (Signed)
Patient called states that she just got the results back from a cologard test and were positive. She did not discuss much about a colonoscopy on her resent visit with Dr. Fuller Plan on 5-8 and would like to know if she should have a colonoscopy scheduled.

## 2019-04-23 NOTE — Telephone Encounter (Signed)
Please notify patient that her cologuard screening test came back positive indicating that there may be an abnormality in the colon that is significant. It does NOT mean there is a colon cancer, but it does require further evaluation. Please let her know she needs to see a GI specialist to discuss setting up a colonoscopy.

## 2019-04-23 NOTE — Telephone Encounter (Signed)
Spoke with patient and advised to contact Dr. Lucio Edward (she has already seen him recently regarding another unrelated issue.  Would need to find out from their office if can just be scheduled for colonoscopy or needs another appt for consult first.  She verbalized understanding.

## 2019-04-23 NOTE — Telephone Encounter (Signed)
Received positive cologuard result.  Forwarded to Dr. Jonni Sanger for follow up.

## 2019-04-23 NOTE — Telephone Encounter (Signed)
Okay for a direct colon for positive cologuard?

## 2019-04-23 NOTE — Telephone Encounter (Signed)
Spoke with Lelon Frohlich and confirmed receipt of positive cologuard result for this patient.

## 2019-04-24 NOTE — Telephone Encounter (Signed)
Patient notified and has been scheduled for 06/09/19 and previsit for 05/26/19

## 2019-04-28 ENCOUNTER — Ambulatory Visit (HOSPITAL_COMMUNITY)
Admission: RE | Admit: 2019-04-28 | Discharge: 2019-04-28 | Disposition: A | Discharge status: Home / Self Care | Payer: Medicare Other | Source: Ambulatory Visit | Attending: Gastroenterology | Admitting: Gastroenterology

## 2019-04-28 ENCOUNTER — Other Ambulatory Visit: Payer: Self-pay

## 2019-04-28 DIAGNOSIS — Z9049 Acquired absence of other specified parts of digestive tract: Secondary | ICD-10-CM | POA: Diagnosis not present

## 2019-04-28 DIAGNOSIS — K838 Other specified diseases of biliary tract: Secondary | ICD-10-CM | POA: Diagnosis not present

## 2019-04-28 DIAGNOSIS — R748 Abnormal levels of other serum enzymes: Secondary | ICD-10-CM | POA: Diagnosis not present

## 2019-04-29 ENCOUNTER — Other Ambulatory Visit: Payer: Self-pay

## 2019-04-29 DIAGNOSIS — R748 Abnormal levels of other serum enzymes: Secondary | ICD-10-CM

## 2019-04-29 DIAGNOSIS — R933 Abnormal findings on diagnostic imaging of other parts of digestive tract: Secondary | ICD-10-CM

## 2019-04-29 NOTE — Progress Notes (Signed)
Mr abd

## 2019-05-04 ENCOUNTER — Other Ambulatory Visit: Payer: Self-pay | Admitting: Gastroenterology

## 2019-05-04 ENCOUNTER — Other Ambulatory Visit: Payer: Self-pay

## 2019-05-04 ENCOUNTER — Ambulatory Visit (HOSPITAL_COMMUNITY)
Admission: RE | Admit: 2019-05-04 | Discharge: 2019-05-04 | Disposition: A | Discharge status: Home / Self Care | Payer: Medicare Other | Source: Ambulatory Visit | Attending: Gastroenterology | Admitting: Gastroenterology

## 2019-05-04 DIAGNOSIS — R933 Abnormal findings on diagnostic imaging of other parts of digestive tract: Secondary | ICD-10-CM | POA: Diagnosis not present

## 2019-05-04 DIAGNOSIS — R748 Abnormal levels of other serum enzymes: Secondary | ICD-10-CM | POA: Diagnosis not present

## 2019-05-04 DIAGNOSIS — K760 Fatty (change of) liver, not elsewhere classified: Secondary | ICD-10-CM | POA: Diagnosis not present

## 2019-05-04 MED ORDER — GADOBUTROL 1 MMOL/ML IV SOLN
6.0000 mL | Freq: Once | INTRAVENOUS | Status: AC | PRN
  Administered 2019-05-04: 6 mL via INTRAVENOUS

## 2019-05-05 ENCOUNTER — Other Ambulatory Visit: Payer: Self-pay

## 2019-05-05 DIAGNOSIS — R918 Other nonspecific abnormal finding of lung field: Secondary | ICD-10-CM

## 2019-05-06 DIAGNOSIS — D3002 Benign neoplasm of left kidney: Secondary | ICD-10-CM | POA: Diagnosis not present

## 2019-05-06 DIAGNOSIS — D3000 Benign neoplasm of unspecified kidney: Secondary | ICD-10-CM

## 2019-05-06 HISTORY — DX: Benign neoplasm of unspecified kidney: D30.00

## 2019-05-08 ENCOUNTER — Ambulatory Visit: Payer: Medicare Other | Admitting: *Deleted

## 2019-05-08 ENCOUNTER — Other Ambulatory Visit: Payer: Self-pay

## 2019-05-08 VITALS — Ht 59.0 in | Wt 130.0 lb

## 2019-05-08 DIAGNOSIS — R195 Other fecal abnormalities: Secondary | ICD-10-CM

## 2019-05-08 MED ORDER — NA SULFATE-K SULFATE-MG SULF 17.5-3.13-1.6 GM/177ML PO SOLN: 1.0000 | Freq: Once | ORAL | 0 refills | Status: AC

## 2019-05-08 NOTE — Progress Notes (Signed)
No egg or soy allergy known to patient  No issues with past sedation with any surgeries  or procedures, no intubation problems  No diet pills per patient No home 02 use per patient  No blood thinners per patient  Pt denies issues with constipation  No A fib or A flutter  EMMI video sent to pt's e mail   Pt verified name, DOB, address and insurance during PV today. Pt mailed instruction packet to included paper to complete and mail back to [facility] with addressed and stamped envelope, Emmi video, copy of consent form to read and not return, and instructions.  PV completed over the phone. Pt encouraged to call with questions or issues

## 2019-05-12 ENCOUNTER — Encounter: Payer: Self-pay | Admitting: Physical Therapy

## 2019-05-12 ENCOUNTER — Ambulatory Visit (INDEPENDENT_AMBULATORY_CARE_PROVIDER_SITE_OTHER)
Admission: RE | Admit: 2019-05-12 | Discharge: 2019-05-12 | Disposition: A | Discharge status: Home / Self Care | Payer: Medicare Other | Source: Ambulatory Visit | Attending: Gastroenterology | Admitting: Gastroenterology

## 2019-05-12 ENCOUNTER — Other Ambulatory Visit: Payer: Self-pay

## 2019-05-12 DIAGNOSIS — R918 Other nonspecific abnormal finding of lung field: Secondary | ICD-10-CM

## 2019-05-12 MED ORDER — IOHEXOL 300 MG/ML  SOLN
80.0000 mL | Freq: Once | INTRAMUSCULAR | Status: AC | PRN
  Administered 2019-05-12: 12:00:00 80 mL via INTRAVENOUS

## 2019-05-13 ENCOUNTER — Other Ambulatory Visit: Payer: Self-pay

## 2019-05-13 DIAGNOSIS — R918 Other nonspecific abnormal finding of lung field: Secondary | ICD-10-CM

## 2019-05-18 ENCOUNTER — Telehealth: Payer: Self-pay | Admitting: Family Medicine

## 2019-05-18 NOTE — Telephone Encounter (Signed)
See note  Copied from [place] [id number]. Topic: Referral - Request for Referral >> May 15, 2019  8:18 AM Lennox Solders wrote: Has patient seen PCP for this complaint?  Yes. Pt would like a referral to oncologist for hilar mass

## 2019-05-19 ENCOUNTER — Encounter: Payer: Self-pay | Admitting: Pulmonary Disease

## 2019-05-19 ENCOUNTER — Other Ambulatory Visit: Payer: Self-pay

## 2019-05-19 ENCOUNTER — Ambulatory Visit (INDEPENDENT_AMBULATORY_CARE_PROVIDER_SITE_OTHER): Payer: Medicare Other | Admitting: Pulmonary Disease

## 2019-05-19 VITALS — BP 126/68 | HR 78 | Temp 98.1°F | Ht 59.5 in | Wt 131.2 lb

## 2019-05-19 DIAGNOSIS — R9389 Abnormal findings on diagnostic imaging of other specified body structures: Secondary | ICD-10-CM | POA: Diagnosis not present

## 2019-05-19 NOTE — Telephone Encounter (Signed)
Noted  

## 2019-05-19 NOTE — Telephone Encounter (Signed)
I called and spoke with patient and communicated the message below. She stated she understood and appreciated the call. She has her consult today actually and understands the provider will investigate and notify Dr. Rogers Blocker on updates on how to proceed. No further action required at this time.

## 2019-05-19 NOTE — Telephone Encounter (Signed)
GI already has referred her to pulmonology which is who she needs to see before oncology. The lung doctor will further investigate hilar mass in lungs and only if carcinogenic will need to see oncology.   Orma Flaming, MD [place]

## 2019-05-19 NOTE — Addendum Note (Signed)
Addended by: Joella Prince on: 05/19/2019 04:17 PM   Modules accepted: Orders

## 2019-05-19 NOTE — Patient Instructions (Signed)
Lung mass with adenopathy Adrenal mass  Refer to oncology Bone scan  I will see you back in the office in about 2 to 3 weeks  Oncology will be the best specialty to oversee and guide further evaluation of the highly likely neoplastic process  Call with any significant concerns  Bronchoscopy as discussed can be scheduled

## 2019-05-19 NOTE — Progress Notes (Signed)
Subjective:     Patient ID: Latoya Mcfarland, female   DOB: Jan 18, 1946, 73 y.o.   MRN: [id number]  HPI Patient being seen for a hilar mass  She is an active smoker, half pack a day smoker, smoked for over 50 years She started having some abdominal discomfort on the rib cage on the left side  Evaluation led to an MRI showing an adrenal mass A CT scan of the chest was performed showing a right hilar mass with adenopathy  Patient has seen urology-discussions ongoing about removing the adrenal mass She is here for the lung mass and we did discuss bronchoscopy  Concerned about the elevated alkaline phosphatase and bone metastases  We did discuss need for oncology involvement  She recently lost her spouse to metastatic cancer  She is unsure how aggressive she wants to be with therapy We did discuss the need to at least have a diagnosis to make her aware what she is dealing with  Best intervention will be the only intervention that provides adequate staging of this highly likely neoplastic metastatic process  Review of Systems  Constitutional: Positive for diaphoresis and fatigue.  HENT: Negative.   Eyes: Negative.   Respiratory: Positive for cough, chest tightness and shortness of breath.   Cardiovascular: Negative.   Gastrointestinal: Positive for constipation and diarrhea.  Endocrine: Negative.   Genitourinary: Negative.   Musculoskeletal: Positive for back pain.  Skin: Negative.   Allergic/Immunologic: Negative.   Neurological: Negative.   Hematological: Negative.   Psychiatric/Behavioral: The patient is nervous/anxious.    Past Medical History:  Diagnosis Date  . Anemia   . Anxiety   . 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 Depression   . Diverticulitis   . GERD (gastroesophageal reflux disease)   . Heart murmur   . History of chicken pox   . Hyperlipidemia   . Hypertension    Social History   Socioeconomic History  . Marital status: Widowed    Spouse name:  Not on file  . Number of children: Not on file  . Years of education: Not on file  . Highest education level: Not on file  Occupational History  . Not on file  Social Needs  . Financial resource strain: Not on file  . Food insecurity    Worry: Not on file    Inability: Not on file  . Transportation needs    Medical: Not on file    Non-medical: Not on file  Tobacco Use  . Smoking status: Former Smoker    Packs/day: 1.00    Years: 50.00    Pack years: 50.00    Types: Cigarettes    Quit date: 04/19/2019    Years since quitting: 0.0  . Smokeless tobacco: Never Used  Substance and Sexual Activity  . Alcohol use: Not Currently  . Drug use: Never  . Sexual activity: Not Currently  Lifestyle  . Physical activity    Days per week: Not on file    Minutes per session: Not on file  . Stress: Not on file  Relationships  . Social Herbalist on phone: Not on file    Gets together: Not on file    Attends religious service: Not on file    Active member of club or organization: Not on file    Attends meetings of clubs or organizations: Not on file    Relationship status: Not on file  . Intimate partner violence    Fear of current or  ex partner: Not on file    Emotionally abused: Not on file    Physically abused: Not on file    Forced sexual activity: Not on file  Other Topics Concern  . Not on file  Social History Narrative  . Not on file   Family History  Problem Relation Age of Onset  . COPD Mother   . Heart attack Father   . Stroke Brother   . Colon cancer Neg Hx   . Colon polyps Neg Hx   . Esophageal cancer Neg Hx   . Stomach cancer Neg Hx   . Rectal cancer Neg Hx        Objective:   Physical Exam Constitutional:      Appearance: Normal appearance.  HENT:     Head: Normocephalic and atraumatic.     Nose: No congestion.  Eyes:     Pupils: Pupils are equal, round, and reactive to light.  Neck:     Musculoskeletal: Normal range of motion and neck  supple.  Cardiovascular:     Rate and Rhythm: Normal rate and regular rhythm.     Heart sounds: No murmur.  Pulmonary:     Effort: Pulmonary effort is normal. No respiratory distress.     Breath sounds: Normal breath sounds. No stridor. No rhonchi.  Abdominal:     General: Abdomen is flat. There is no distension.     Palpations: Abdomen is soft.     Tenderness: There is no abdominal tenderness.  Musculoskeletal: Normal range of motion.  Skin:    General: Skin is warm and dry.     Coloration: Skin is not jaundiced or pale.  Neurological:     General: No focal deficit present.     Mental Status: She is alert.    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  Active smoker  Emphysema -No significant symptoms of shortness of breath or chronic cough  Adrenal mass -Being evaluated by urology    Plan:     Bronchoscopy discussed-bronchoscopy with EBUS for lymph node biopsy  Patient is unsure about how aggressive she wants to be with treatment at present  She is concerned about fragmentation of seeing urology separate from seeing a pulmonologist and we did talk about the need to see an oncologist who might be able to tie things together and guide further evaluation and care  We will make a referral to oncology  We will order a bone scan  I will see her in about 2 weeks  I did reassure her that even if she does not want aggressive treatment, the only way to keep a metastatic neoplastic process at bay or contained will be figuring out what we are dealing with and then having a plan of care of treatment that may be well-tolerated   We will try and get her in touch with oncology

## 2019-05-21 ENCOUNTER — Telehealth: Payer: Self-pay | Admitting: Internal Medicine

## 2019-05-21 ENCOUNTER — Encounter: Payer: Self-pay | Admitting: *Deleted

## 2019-05-21 ENCOUNTER — Telehealth: Payer: Self-pay | Admitting: Gastroenterology

## 2019-05-21 DIAGNOSIS — R918 Other nonspecific abnormal finding of lung field: Secondary | ICD-10-CM

## 2019-05-21 NOTE — Telephone Encounter (Signed)
Patient has been rescheduled to 06/16/19.  She will call back if it needs to be postponed further based on the biopsy

## 2019-05-21 NOTE — Telephone Encounter (Signed)
dr. Louis Meckel urology adrenal gland biopsy  Patient called said that Dr. Louis Meckel at urology asked her if she can move her colonoscopy out a little until she gets her adrenal gland biopsy rsults.

## 2019-05-21 NOTE — Telephone Encounter (Signed)
Spoke with patient re new patient with Dr. Julien Nordmann. Confirmed date/time/location/demographics.

## 2019-05-21 NOTE — Progress Notes (Signed)
Oncology Nurse Navigator Documentation  Oncology Nurse Navigator Flowsheets 05/21/2019  Navigator Location [facility]  Navigator Encounter Type Other/I received referral today on Latoya Mcfarland.  I updated new patient coordinator to call and scheduled patient to be seen on 7/13 with Dr. Julien Nordmann and labs   Treatment Phase Abnormal Scans  Barriers/Navigation Needs Coordination of Care  Interventions Coordination of Care  Coordination of Care Other  Acuity Level 2  Time Spent with Patient 15

## 2019-05-22 ENCOUNTER — Other Ambulatory Visit (HOSPITAL_COMMUNITY): Payer: Self-pay | Admitting: Urology

## 2019-05-22 DIAGNOSIS — D3000 Benign neoplasm of unspecified kidney: Secondary | ICD-10-CM

## 2019-05-25 ENCOUNTER — Encounter: Payer: Self-pay | Admitting: *Deleted

## 2019-05-25 ENCOUNTER — Encounter: Payer: Medicare Other | Admitting: Gastroenterology

## 2019-05-25 ENCOUNTER — Encounter: Payer: Self-pay | Admitting: Internal Medicine

## 2019-05-25 ENCOUNTER — Inpatient Hospital Stay: Payer: Medicare Other

## 2019-05-25 ENCOUNTER — Inpatient Hospital Stay: Payer: Medicare Other | Attending: Internal Medicine | Admitting: Internal Medicine

## 2019-05-25 ENCOUNTER — Telehealth: Payer: Self-pay | Admitting: Internal Medicine

## 2019-05-25 ENCOUNTER — Other Ambulatory Visit: Payer: Self-pay

## 2019-05-25 DIAGNOSIS — Z7982 Long term (current) use of aspirin: Secondary | ICD-10-CM | POA: Diagnosis not present

## 2019-05-25 DIAGNOSIS — C3490 Malignant neoplasm of unspecified part of unspecified bronchus or lung: Secondary | ICD-10-CM

## 2019-05-25 DIAGNOSIS — E278 Other specified disorders of adrenal gland: Secondary | ICD-10-CM | POA: Insufficient documentation

## 2019-05-25 DIAGNOSIS — Z79899 Other long term (current) drug therapy: Secondary | ICD-10-CM | POA: Diagnosis not present

## 2019-05-25 DIAGNOSIS — R918 Other nonspecific abnormal finding of lung field: Secondary | ICD-10-CM

## 2019-05-25 DIAGNOSIS — C3431 Malignant neoplasm of lower lobe, right bronchus or lung: Secondary | ICD-10-CM | POA: Diagnosis not present

## 2019-05-25 DIAGNOSIS — I10 Essential (primary) hypertension: Secondary | ICD-10-CM | POA: Diagnosis not present

## 2019-05-25 DIAGNOSIS — Z87891 Personal history of nicotine dependence: Secondary | ICD-10-CM | POA: Insufficient documentation

## 2019-05-25 DIAGNOSIS — J984 Other disorders of lung: Secondary | ICD-10-CM | POA: Insufficient documentation

## 2019-05-25 DIAGNOSIS — C7972 Secondary malignant neoplasm of left adrenal gland: Secondary | ICD-10-CM | POA: Diagnosis not present

## 2019-05-25 LAB — CBC WITH DIFFERENTIAL (CANCER CENTER ONLY)
Abs Immature Granulocytes: 0.03 10*3/uL (ref 0.00–0.07)
Basophils Absolute: 0.1 10*3/uL (ref 0.0–0.1)
Basophils Relative: 0 %
Eosinophils Absolute: 0.1 10*3/uL (ref 0.0–0.5)
Eosinophils Relative: 1 %
HCT: 29.4 % — ABNORMAL LOW (ref 36.0–46.0)
Hemoglobin: 9.9 g/dL — ABNORMAL LOW (ref 12.0–15.0)
Immature Granulocytes: 0 %
Lymphocytes Relative: 36 %
Lymphs Abs: 4 10*3/uL (ref 0.7–4.0)
MCH: 28.9 pg (ref 26.0–34.0)
MCHC: 33.7 g/dL (ref 30.0–36.0)
MCV: 85.7 fL (ref 80.0–100.0)
Monocytes Absolute: 0.7 10*3/uL (ref 0.1–1.0)
Monocytes Relative: 7 %
Neutro Abs: 6.3 10*3/uL (ref 1.7–7.7)
Neutrophils Relative %: 56 %
Platelet Count: 319 10*3/uL (ref 150–400)
RBC: 3.43 MIL/uL — ABNORMAL LOW (ref 3.87–5.11)
RDW: 13 % (ref 11.5–15.5)
WBC Count: 11.2 10*3/uL — ABNORMAL HIGH (ref 4.0–10.5)
nRBC: 0 % (ref 0.0–0.2)

## 2019-05-25 LAB — CMP (CANCER CENTER ONLY)
ALT: 13 U/L (ref 0–44)
AST: 15 U/L (ref 15–41)
Albumin: 4 g/dL (ref 3.5–5.0)
Alkaline Phosphatase: 244 U/L — ABNORMAL HIGH (ref 38–126)
Anion gap: 9 (ref 5–15)
BUN: 27 mg/dL — ABNORMAL HIGH (ref 8–23)
CO2: 22 mmol/L (ref 22–32)
Calcium: 9.3 mg/dL (ref 8.9–10.3)
Chloride: 108 mmol/L (ref 98–111)
Creatinine: 1.14 mg/dL — ABNORMAL HIGH (ref 0.44–1.00)
GFR, Est AFR Am: 56 mL/min — ABNORMAL LOW (ref >60)
GFR, Estimated: 48 mL/min — ABNORMAL LOW (ref >60)
Glucose, Bld: 98 mg/dL (ref 70–99)
Potassium: 4.3 mmol/L (ref 3.5–5.1)
Sodium: 139 mmol/L (ref 135–145)
Total Bilirubin: 0.2 mg/dL — ABNORMAL LOW (ref 0.3–1.2)
Total Protein: 7.1 g/dL (ref 6.5–8.1)

## 2019-05-25 NOTE — Telephone Encounter (Signed)
Gave avs and calendar ° °

## 2019-05-25 NOTE — Progress Notes (Signed)
Oncology Nurse Navigator Documentation  Oncology Nurse Navigator Flowsheets 05/25/2019  Navigator Location [facility]  Navigator Encounter Type Other/per Dr. Julien Nordmann, I called central scheduling and had patient's bone scan cancelled.   Patient Visit Type MedOnc  Treatment Phase Abnormal Scans  Barriers/Navigation Needs Coordination of Care  Interventions Coordination of Care  Coordination of Care Other  Acuity Level 2  Time Spent with Patient 15

## 2019-05-25 NOTE — Progress Notes (Signed)
[facility] [phone]   [phone]  CONSULT NOTE  REFERRING PHYSICIAN: Dr. Sherrilyn Rist  REASON FOR CONSULTATION:  73 years old white female with suspicious lung cancer.  HPI Latoya Mcfarland is a 73 y.o. female with past medical history significant for hypertension, dyslipidemia, GERD, diverticulosis, depression and anxiety as well as anemia.  She also has a long history of smoking but quit 2 months ago.  The patient mentioned that she has a routine evaluation by her primary care physician in [place] when her blood work in 2019 showed elevated alkaline phosphatase.  She was taken care of her husband who was in a terminal condition with lung cancer and was on hospice care at that time.  She delayed any further investigation of her elevated alkaline phosphatase until she moved to [place] to be closer to family.  She was seen by her primary care physician and repeat alkaline phosphatase on Apr 07, 2018 was elevated at 225.  This finding were concerning for either hepatic or bone disorder.  The patient had Cologuard on 04/14/2019 and it was positive.  The patient was referred to Dr. Fuller Plan for evaluation and he order MRI of the abdomen with MRCP.  This was performed on 05/04/2019 and it showed a large left suprarenal mass measuring about 200 cc cubic centimeter in volume and measuring 8.7 x 5.9 x 8.7 cm favored to be originating from the left adrenal gland but also has significant abutment against the left kidney.  The scan also showed a suspicious right hilar mass.  This was followed by CT scan of the chest with contrast on 05/12/2019 and it showed 4.4 by 2.9 x 4.9 cm irregular right 3 through hilar mass with areas of central cavitation.  This lesion involves and crosses the major fissure and contiguous with the medial pleura.  There was also scattered bilateral pulmonary nodules measuring up to 5 mm and metastatic disease was not excluded.  The scan also  showed the large left suprahilar mass.  The patient was seen by urology, Dr. Louis Meckel and he ordered a CT guided core biopsy of the left suprarenal mass and this is scheduled to be performed on 06/01/2019.  She was also seen by Dr. Ander Slade and she was scheduled to have a bone scan tomorrow because of the elevated alkaline phosphatase. She was referred to me today for further evaluation and recommendation regarding her condition. When seen today the patient is feeling fine except for pressure in the left upper quadrant as well as shortness of breath with exertion but no significant chest pain, cough or hemoptysis.  She has mild weight loss but no significant nausea, vomiting, diarrhea or constipation.  She denied having any headache or visual changes. Family history significant for mother with COPD, father had heart disease. The patient is a widow and has 1 son, Dr. Melina Copa who works as a ED physician at [employer].  She used to work in [employer], hospital.  She has a history for smoking more than 1 pack/day for over 50 years and quit a month ago.  She drinks alcohol occasionally and no history of drug abuse.   HPI  Past Medical History:  Diagnosis Date   Anemia    Anxiety    Benign neoplasm of kidney 05/06/2019   Depression    Diverticulitis    GERD (gastroesophageal reflux disease)    Heart murmur    History of chicken pox    Hyperlipidemia  Hypertension     Past Surgical History:  Procedure Laterality Date   ABDOMINAL HYSTERECTOMY  1992   APPENDECTOMY  1961   CHOLECYSTECTOMY  2000   TONSILLECTOMY AND ADENOIDECTOMY  1959    Family History  Problem Relation Age of Onset   COPD Mother    Heart attack Father    Stroke Brother    Colon cancer Neg Hx    Colon polyps Neg Hx    Esophageal cancer Neg Hx    Stomach cancer Neg Hx    Rectal cancer Neg Hx     Social History Social History   Tobacco Use   Smoking status: Former Smoker    Packs/day: 1.00     Years: 50.00    Pack years: 50.00    Types: Cigarettes    Quit date: 04/19/2019    Years since quitting: 0.0   Smokeless tobacco: Never Used  Substance Use Topics   Alcohol use: Not Currently   Drug use: Never    Allergies  Allergen Reactions   Atorvastatin Hives   Lescol [Fluvastatin] Hives    Current Outpatient Medications  Medication Sig Dispense Refill   ALPRAZolam (XANAX) 0.25 MG tablet Take 1 tablet (0.25 mg total) by mouth 2 (two) times daily as needed for anxiety. 60 tablet 0   aspirin EC 81 MG tablet Take 81 mg by mouth daily.     Cholecalciferol (VITAMIN D3 PO) Take by mouth daily.     lisinopril (ZESTRIL) 30 MG tablet Take 1 tablet (30 mg total) by mouth daily. 90 tablet 1   Magnesium 400 MG TABS Take by mouth daily.     Multiple Vitamin (MULTIVITAMIN) tablet Take 1 tablet by mouth daily.     Multiple Vitamins-Minerals (PRESERVISION AREDS 2 PO) Take by mouth 2 (two) times a day.     omeprazole (PRILOSEC) 40 MG capsule      rosuvastatin (CRESTOR) 20 MG tablet Take 20 mg by mouth daily.     No current facility-administered medications for this visit.     Review of Systems  Constitutional: positive for fatigue and weight loss Eyes: negative Ears, nose, mouth, throat, and face: negative Respiratory: positive for dyspnea on exertion Cardiovascular: negative Gastrointestinal: positive for abdominal pain Genitourinary:negative Integument/breast: negative Hematologic/lymphatic: negative Musculoskeletal:negative Neurological: negative Behavioral/Psych: negative Endocrine: negative Allergic/Immunologic: negative  Physical Exam  TGP:QDIYM, healthy, no distress, well nourished and well developed SKIN: skin color, texture, turgor are normal, no rashes or significant lesions HEAD: Normocephalic, No masses, lesions, tenderness or abnormalities EYES: normal, PERRLA, Conjunctiva are pink and non-injected EARS: External ears normal, Canals  clear OROPHARYNX:no exudate, no erythema and lips, buccal mucosa, and tongue normal  NECK: supple, no adenopathy, no JVD LYMPH:  no palpable lymphadenopathy, no hepatosplenomegaly BREAST:not examined LUNGS: clear to auscultation , and palpation HEART: regular rate & rhythm, no murmurs and no gallops ABDOMEN:abdomen soft, non-tender, normal bowel sounds and no masses or organomegaly BACK: No CVA tenderness, Range of motion is normal EXTREMITIES:no joint deformities, effusion, or inflammation, no edema  NEURO: alert & oriented x 3 with fluent speech, no focal motor/sensory deficits  PERFORMANCE STATUS: ECOG 1  LABORATORY DATA: Lab Results  Component Value Date   WBC 11.2 (H) 05/25/2019   HGB 9.9 (L) 05/25/2019   HCT 29.4 (L) 05/25/2019   MCV 85.7 05/25/2019   PLT 319 05/25/2019      Chemistry      Component Value Date/Time   Mcfarland 137 04/08/2019 0839   [place]cf[place]land 141 01/08/2018  K 5.3 No hemolysis seen (H) 04/08/2019 0839   CL 102 04/08/2019 0839   CO2 25 04/08/2019 0839   BUN 24 (H) 04/08/2019 0839   CREATININE 1.02 04/08/2019 0839   GLU 102 01/08/2018      Component Value Date/Time   CALCIUM 9.9 04/08/2019 0839   ALKPHOS 234 (H) 04/20/2019 1103   AST 17 04/08/2019 0839   ALT 14 04/08/2019 0839   BILITOT 0.5 04/08/2019 0839       RADIOGRAPHIC STUDIES: Ct Chest W Contrast  Result Date: 05/12/2019 CLINICAL DATA:  Hilar mass. EXAM: CT CHEST WITH CONTRAST TECHNIQUE: Multidetector CT imaging of the chest was performed during intravenous contrast administration. CONTRAST:  57mL OMNIPAQUE IOHEXOL 300 MG/ML  SOLN COMPARISON:  MRI 05/04/2019 FINDINGS: Cardiovascular: The heart size is normal. No substantial pericardial effusion. Atherosclerotic calcification is noted in the wall of the thoracic aorta. Mediastinum/Nodes: Calcified lymph nodes are seen in the mediastinum and left hilum. 2.0 cm short axis right hilar node visible on 61/3. 11 mm short axis inferior right hilar node visible  on 68/3. The esophagus has normal imaging features. There is no axillary lymphadenopathy. Lungs/Pleura: 4.4 x 2.9 x 4.9 cm irregular right retro hilar mass lesion is identified with areas of central cavitation. This lesion involves and crosses the major fissure (sagittal image 66/series 7) and is contiguous with the medial pleura. 4 mm right lower lobe nodule is identified on 58/4. Another tiny 2-3 mm right lower lobe nodule is visible on 61. 5 mm right lower lobe nodule identified on 98/4. 4 mm left lower lobe nodule identified on 84/4. 4 mm left upper lobe nodule evident on 23/4. Additional scattered 3-4 mm pulmonary nodules are evident. Scattered small foci of ground-glass attenuation are seen in the lungs bilaterally. No pleural effusions. Upper Abdomen: Large left suprarenal mass was better characterized on recent abdominal MRI. Gallbladder surgically absent. Musculoskeletal: No worrisome lytic or sclerotic osseous abnormality. IMPRESSION: 1. 4 x 3 x 5 cm irregular right retro hilar lesion is highly suspicious for neoplasm. Primary bronchogenic carcinoma is a distinct concern. Metastatic lymphadenopathy is noted in the right hilum. 2. Scattered bilateral pulmonary nodules measuring up to 5 mm. Metastatic disease not excluded. 3. Large left suprarenal mass better characterized on the recent MRI. 4.  Aortic Atherosclerois (ICD10-170.0) Electronically Signed   By: Misty Stanley M.D.   On: 05/12/2019 15:00   Mr 3d Recon At Scanner  Result Date: 05/04/2019 CLINICAL DATA:  Elevated alkaline phosphatase level. Abdominal pain. EXAM: MRI ABDOMEN WITHOUT AND WITH CONTRAST (INCLUDING MRCP) TECHNIQUE: Multiplanar multisequence MR imaging of the abdomen was performed both before and after the administration of intravenous contrast. Heavily T2-weighted images of the biliary and pancreatic ducts were obtained, and three-dimensional MRCP images were rendered by post processing. CONTRAST:  6 cc Gadavist COMPARISON:   Abdominal ultrasound 04/28/2019 FINDINGS: Lower chest: Seen only on the coronal post-contrast series number 13, there is suspicion for a possible right hilar mass or lymph node measuring about 2.3 by 1.9 cm on image 39/13. This is near the pulmonary artery and strictly speaking I cannot completely exclude right-sided pulmonary embolus, but I favor adenopathy or mass. Hepatobiliary: Very minimal dropout of signal in the liver suggesting minimal hepatic steatosis. Cholecystectomy. There is no significant intrahepatic biliary dilatation. The common hepatic duct measures up to 8 mm diameter and the common bile duct up to 9 mm in diameter without filling defect. There is potentially a 6 mm hemangioma in the lateral segment left hepatic  lobe on image 52/1205, versus a small vascular malformation. Pancreas: The left suprarenal mass partially abuts the pancreas but is not thought to arise from the pancreas. Overall the pancreas appears unremarkable. Spleen:  Unremarkable Adrenals/Urinary Tract: A left suprarenal mass which I favor is originating from the left adrenal gland but which has significant abutment against the left kidney measures 8.7 by 5.9 by 8.7 cm (volume = 230 cm^3) and has generally intermediate precontrast T1 signal characteristics but heterogeneous speckled high and low T2 signal characteristics. This lesion demonstrates mildly heterogeneous progressive enhancement and rim enhancement and effaces the left adrenal gland. There appears to be some significant restriction of diffusion. I do not observe significant fax signal intensity within the lesion on the non fat saturated T1 weighted images. There may be some faint elements of dropout of signal on out of phase images compared to in phase images, but this is mild. For example on image 58/10 and 57/10, the inphase images in the region of interest have a signal intensity of 285 and the out of phase images, 255. Two tiny right renal cysts. Stomach/Bowel:  Unremarkable Vascular/Lymphatic:  Aortoiliac atherosclerotic vascular disease. Other:  No supplemental non-categorized findings. Musculoskeletal: Unremarkable IMPRESSION: 1. There is a large left suprarenal mass measuring about 230 cubic cm in volume. This abuts the kidney and pancreas but I believe it is probably arising from the adrenal gland. Equivocal dropout of signal on out of phase images, but degree of internal heterogeneity and size would be atypical for adrenal adenoma. Other possibilities include metastatic lung cancer, adrenaocortical carcinoma, and pheochromocytoma/paraganglioma. 2. Included on only the coronal post-contrast series, there is suspicion for a possible right hilar mass. CT of the chest with and without contrast is recommended. If this turns out to be a mass, the likelihood of the left adrenal mass being from metastatic lung cancer would be increased. 3. Depending on the findings at chest CT, FDG nuclear medicine PET scan may be warranted to further characterize. If alternatively clinical indicators seem to suggest pheochromocytoma/paraganglioma, then DOTATATE PET might alternatively be considered. 4. Minimal hepatic steatosis. 5. CBD up to 0.9 cm without filling defect. This is mildly dilated but probably a physiologic response to cholecystectomy. 6.  Aortic Atherosclerosis (ICD10-I70.0). Electronically Signed   By: Van Clines M.D.   On: 05/04/2019 15:35   Mr Abdomen Mrcp Moise Boring Contast  Result Date: 05/04/2019 CLINICAL DATA:  Elevated alkaline phosphatase level. Abdominal pain. EXAM: MRI ABDOMEN WITHOUT AND WITH CONTRAST (INCLUDING MRCP) TECHNIQUE: Multiplanar multisequence MR imaging of the abdomen was performed both before and after the administration of intravenous contrast. Heavily T2-weighted images of the biliary and pancreatic ducts were obtained, and three-dimensional MRCP images were rendered by post processing. CONTRAST:  6 cc Gadavist COMPARISON:  Abdominal ultrasound  04/28/2019 FINDINGS: Lower chest: Seen only on the coronal post-contrast series number 13, there is suspicion for a possible right hilar mass or lymph node measuring about 2.3 by 1.9 cm on image 39/13. This is near the pulmonary artery and strictly speaking I cannot completely exclude right-sided pulmonary embolus, but I favor adenopathy or mass. Hepatobiliary: Very minimal dropout of signal in the liver suggesting minimal hepatic steatosis. Cholecystectomy. There is no significant intrahepatic biliary dilatation. The common hepatic duct measures up to 8 mm diameter and the common bile duct up to 9 mm in diameter without filling defect. There is potentially a 6 mm hemangioma in the lateral segment left hepatic lobe on image 52/1205, versus a small vascular malformation.  Pancreas: The left suprarenal mass partially abuts the pancreas but is not thought to arise from the pancreas. Overall the pancreas appears unremarkable. Spleen:  Unremarkable Adrenals/Urinary Tract: A left suprarenal mass which I favor is originating from the left adrenal gland but which has significant abutment against the left kidney measures 8.7 by 5.9 by 8.7 cm (volume = 230 cm^3) and has generally intermediate precontrast T1 signal characteristics but heterogeneous speckled high and low T2 signal characteristics. This lesion demonstrates mildly heterogeneous progressive enhancement and rim enhancement and effaces the left adrenal gland. There appears to be some significant restriction of diffusion. I do not observe significant fax signal intensity within the lesion on the non fat saturated T1 weighted images. There may be some faint elements of dropout of signal on out of phase images compared to in phase images, but this is mild. For example on image 58/10 and 57/10, the inphase images in the region of interest have a signal intensity of 285 and the out of phase images, 255. Two tiny right renal cysts. Stomach/Bowel: Unremarkable  Vascular/Lymphatic:  Aortoiliac atherosclerotic vascular disease. Other:  No supplemental non-categorized findings. Musculoskeletal: Unremarkable IMPRESSION: 1. There is a large left suprarenal mass measuring about 230 cubic cm in volume. This abuts the kidney and pancreas but I believe it is probably arising from the adrenal gland. Equivocal dropout of signal on out of phase images, but degree of internal heterogeneity and size would be atypical for adrenal adenoma. Other possibilities include metastatic lung cancer, adrenaocortical carcinoma, and pheochromocytoma/paraganglioma. 2. Included on only the coronal post-contrast series, there is suspicion for a possible right hilar mass. CT of the chest with and without contrast is recommended. If this turns out to be a mass, the likelihood of the left adrenal mass being from metastatic lung cancer would be increased. 3. Depending on the findings at chest CT, FDG nuclear medicine PET scan may be warranted to further characterize. If alternatively clinical indicators seem to suggest pheochromocytoma/paraganglioma, then DOTATATE PET might alternatively be considered. 4. Minimal hepatic steatosis. 5. CBD up to 0.9 cm without filling defect. This is mildly dilated but probably a physiologic response to cholecystectomy. 6.  Aortic Atherosclerosis (ICD10-I70.0). Electronically Signed   By: Van Clines M.D.   On: 05/04/2019 15:35   US Abdomen Limited Ruq  Result Date: 04/28/2019 CLINICAL DATA:  Elevated alkaline phosphatase.  Abnormal LFTs. EXAM: ULTRASOUND ABDOMEN LIMITED RIGHT UPPER QUADRANT COMPARISON:  No prior. FINDINGS: Gallbladder: Cholecystectomy. Common bile duct: Diameter: 10.1 mm Liver: Tiny well-circumscribed 7 mm hyperechoic focus in medial aspect of the liver, statistically most likely a tiny hemangioma. Follow-up exam can be obtained to demonstrate stability. Portal vein is patent on color Doppler imaging with normal direction of blood flow towards  the liver. IMPRESSION: 1. Cholecystectomy. Common bile duct is dilated to 10.1 mm. Although this could be secondary to cholecystectomy obstructing lesion cannot be excluded. MRI of the liver with MRCP may prove useful. 2. Tiny well-circumscribed 7 mm hyperechoic focus in the medial aspect of the liver, statistically most likely a tiny hemangioma. Follow-up exam can be obtained to demonstrate stability. Electronically Signed   By: Marcello Moores  Register   On: 04/28/2019 15:31    ASSESSMENT: This is a very pleasant 73 years old white female with suspicious right lower lobe lung cancer in addition to large left suprarenal mass.  The 2 lesion could be related to each other but they also could be separate etiology.   PLAN: I had a lengthy discussion with  the patient today about her current condition and further investigation to confirm her diagnosis. I personally and independently reviewed the imaging studies including the MRI of the abdomen as well as the CT scan of the chest and discussed the results with the patient today. I recommended for the patient to proceed with the CT-guided core biopsy of the left suprarenal gland lesion as planned. I will also arrange for the patient to have a PET scan performed for further evaluation of her disease and to rule out any other metastatic lesions. I will cancel the bone scan as the PET scan will be very sensitive for any bone lesion and there is no need to have 2 imaging studies for the same purpose. I will see the patient back for follow-up visit in around 2 weeks for evaluation and discussion of her treatment options based on the imaging studies and the biopsy results. The patient was advised to call immediately if she has any concerning symptoms in the interval. The patient voices understanding of current disease status and treatment options and is in agreement with the current care plan.  All questions were answered. The patient knows to call the clinic with any  problems, questions or concerns. We can certainly see the patient much sooner if necessary.  Thank you so much for allowing me to participate in the care of Latoya Mcfarland. I will continue to follow up the patient with you and assist in her care.  I spent 40 minutes counseling the patient face to face. The total time spent in the appointment was 60 minutes.  Disclaimer: This note was dictated with voice recognition software. Similar sounding words can inadvertently be transcribed and may not be corrected upon review.   Eilleen Kempf May 25, 2019, 2:18 PM

## 2019-05-25 NOTE — Progress Notes (Signed)
Patient updated that her bone scan is cancelled

## 2019-05-26 ENCOUNTER — Encounter (HOSPITAL_COMMUNITY): Payer: Self-pay

## 2019-05-26 ENCOUNTER — Encounter (HOSPITAL_COMMUNITY): Payer: Medicare Other

## 2019-05-29 ENCOUNTER — Other Ambulatory Visit: Payer: Self-pay | Admitting: Radiology

## 2019-06-01 ENCOUNTER — Encounter (HOSPITAL_COMMUNITY): Payer: Self-pay

## 2019-06-01 ENCOUNTER — Ambulatory Visit (HOSPITAL_COMMUNITY)
Admission: RE | Admit: 2019-06-01 | Discharge: 2019-06-01 | Disposition: A | Discharge status: Home / Self Care | Payer: Medicare Other | Source: Ambulatory Visit | Attending: Urology | Admitting: Urology

## 2019-06-01 ENCOUNTER — Other Ambulatory Visit: Payer: Self-pay

## 2019-06-01 DIAGNOSIS — R748 Abnormal levels of other serum enzymes: Secondary | ICD-10-CM | POA: Diagnosis not present

## 2019-06-01 DIAGNOSIS — I10 Essential (primary) hypertension: Secondary | ICD-10-CM | POA: Diagnosis not present

## 2019-06-01 DIAGNOSIS — Z823 Family history of stroke: Secondary | ICD-10-CM | POA: Diagnosis not present

## 2019-06-01 DIAGNOSIS — R109 Unspecified abdominal pain: Secondary | ICD-10-CM | POA: Diagnosis not present

## 2019-06-01 DIAGNOSIS — D3000 Benign neoplasm of unspecified kidney: Secondary | ICD-10-CM

## 2019-06-01 DIAGNOSIS — K219 Gastro-esophageal reflux disease without esophagitis: Secondary | ICD-10-CM | POA: Diagnosis not present

## 2019-06-01 DIAGNOSIS — R590 Localized enlarged lymph nodes: Secondary | ICD-10-CM | POA: Diagnosis not present

## 2019-06-01 DIAGNOSIS — Z888 Allergy status to other drugs, medicaments and biological substances status: Secondary | ICD-10-CM | POA: Insufficient documentation

## 2019-06-01 DIAGNOSIS — M549 Dorsalgia, unspecified: Secondary | ICD-10-CM | POA: Insufficient documentation

## 2019-06-01 DIAGNOSIS — E279 Disorder of adrenal gland, unspecified: Secondary | ICD-10-CM | POA: Diagnosis not present

## 2019-06-01 DIAGNOSIS — F419 Anxiety disorder, unspecified: Secondary | ICD-10-CM | POA: Diagnosis not present

## 2019-06-01 DIAGNOSIS — R918 Other nonspecific abnormal finding of lung field: Secondary | ICD-10-CM | POA: Insufficient documentation

## 2019-06-01 DIAGNOSIS — Z87891 Personal history of nicotine dependence: Secondary | ICD-10-CM | POA: Diagnosis not present

## 2019-06-01 DIAGNOSIS — Z825 Family history of asthma and other chronic lower respiratory diseases: Secondary | ICD-10-CM | POA: Diagnosis not present

## 2019-06-01 DIAGNOSIS — Z79899 Other long term (current) drug therapy: Secondary | ICD-10-CM | POA: Diagnosis not present

## 2019-06-01 DIAGNOSIS — C801 Malignant (primary) neoplasm, unspecified: Secondary | ICD-10-CM | POA: Diagnosis not present

## 2019-06-01 DIAGNOSIS — Z8249 Family history of ischemic heart disease and other diseases of the circulatory system: Secondary | ICD-10-CM | POA: Insufficient documentation

## 2019-06-01 DIAGNOSIS — C7972 Secondary malignant neoplasm of left adrenal gland: Secondary | ICD-10-CM | POA: Insufficient documentation

## 2019-06-01 DIAGNOSIS — C3490 Malignant neoplasm of unspecified part of unspecified bronchus or lung: Secondary | ICD-10-CM | POA: Diagnosis not present

## 2019-06-01 DIAGNOSIS — Z7982 Long term (current) use of aspirin: Secondary | ICD-10-CM | POA: Insufficient documentation

## 2019-06-01 DIAGNOSIS — C3491 Malignant neoplasm of unspecified part of right bronchus or lung: Secondary | ICD-10-CM | POA: Diagnosis not present

## 2019-06-01 DIAGNOSIS — E785 Hyperlipidemia, unspecified: Secondary | ICD-10-CM | POA: Diagnosis not present

## 2019-06-01 LAB — CBC WITH DIFFERENTIAL/PLATELET
Abs Immature Granulocytes: 0.05 10*3/uL (ref 0.00–0.07)
Basophils Absolute: 0.1 10*3/uL (ref 0.0–0.1)
Basophils Relative: 1 %
Eosinophils Absolute: 0.4 10*3/uL (ref 0.0–0.5)
Eosinophils Relative: 3 %
HCT: 31.5 % — ABNORMAL LOW (ref 36.0–46.0)
Hemoglobin: 9.9 g/dL — ABNORMAL LOW (ref 12.0–15.0)
Immature Granulocytes: 0 %
Lymphocytes Relative: 26 %
Lymphs Abs: 3.3 10*3/uL (ref 0.7–4.0)
MCH: 28 pg (ref 26.0–34.0)
MCHC: 31.4 g/dL (ref 30.0–36.0)
MCV: 89.2 fL (ref 80.0–100.0)
Monocytes Absolute: 0.8 10*3/uL (ref 0.1–1.0)
Monocytes Relative: 6 %
Neutro Abs: 8.2 10*3/uL — ABNORMAL HIGH (ref 1.7–7.7)
Neutrophils Relative %: 64 %
Platelets: 275 10*3/uL (ref 150–400)
RBC: 3.53 MIL/uL — ABNORMAL LOW (ref 3.87–5.11)
RDW: 13.2 % (ref 11.5–15.5)
WBC: 12.8 10*3/uL — ABNORMAL HIGH (ref 4.0–10.5)
nRBC: 0 % (ref 0.0–0.2)

## 2019-06-01 LAB — COMPREHENSIVE METABOLIC PANEL
ALT: 17 U/L (ref 0–44)
AST: 22 U/L (ref 15–41)
Albumin: 4 g/dL (ref 3.5–5.0)
Alkaline Phosphatase: 227 U/L — ABNORMAL HIGH (ref 38–126)
Anion gap: 13 (ref 5–15)
BUN: 24 mg/dL — ABNORMAL HIGH (ref 8–23)
CO2: 21 mmol/L — ABNORMAL LOW (ref 22–32)
Calcium: 9.7 mg/dL (ref 8.9–10.3)
Chloride: 105 mmol/L (ref 98–111)
Creatinine, Ser: 1.06 mg/dL — ABNORMAL HIGH (ref 0.44–1.00)
GFR calc Af Amer: >60 mL/min (ref >60)
GFR calc non Af Amer: 52 mL/min — ABNORMAL LOW (ref >60)
Glucose, Bld: 116 mg/dL — ABNORMAL HIGH (ref 70–99)
Potassium: 5 mmol/L (ref 3.5–5.1)
Sodium: 139 mmol/L (ref 135–145)
Total Bilirubin: 0.5 mg/dL (ref 0.3–1.2)
Total Protein: 7.1 g/dL (ref 6.5–8.1)

## 2019-06-01 LAB — PROTIME-INR
INR: 1 (ref 0.8–1.2)
Prothrombin Time: 13 seconds (ref 11.4–15.2)

## 2019-06-01 MED ORDER — FENTANYL CITRATE (PF) 100 MCG/2ML IJ SOLN
INTRAMUSCULAR | Status: AC | PRN
  Administered 2019-06-01 (×2): 50 ug via INTRAVENOUS

## 2019-06-01 MED ORDER — SODIUM CHLORIDE 0.9 % IV SOLN
INTRAVENOUS | Status: DC
  Administered 2019-06-01: 08:00:00 via INTRAVENOUS

## 2019-06-01 MED ORDER — MIDAZOLAM HCL 2 MG/2ML IJ SOLN
INTRAMUSCULAR | Status: AC
  Filled 2019-06-01: qty 4

## 2019-06-01 MED ORDER — LIDOCAINE HCL (PF) 1 % IJ SOLN
INTRAMUSCULAR | Status: AC | PRN
  Administered 2019-06-01: 10 mL

## 2019-06-01 MED ORDER — MIDAZOLAM HCL 2 MG/2ML IJ SOLN
INTRAMUSCULAR | Status: AC | PRN
  Administered 2019-06-01 (×2): 1 mg via INTRAVENOUS

## 2019-06-01 MED ORDER — FENTANYL CITRATE (PF) 100 MCG/2ML IJ SOLN
INTRAMUSCULAR | Status: AC
  Filled 2019-06-01: qty 2

## 2019-06-01 NOTE — Discharge Instructions (Signed)
Percutaneous  Biopsy, Care After This sheet gives you information about how to care for yourself after your procedure. Your health care provider may also give you more specific instructions. If you have problems or questions, contact your health care provider. What can I expect after the procedure? After the procedure, it is common to have:  Pain or soreness near the area where the needle went through your skin (biopsy site).  Bright pink or cloudy urine for 24 hours after the procedure. Follow these instructions at home: Activity  Return to your normal activities as told by your health care provider. Ask your health care provider what activities are safe for you.  Do not drive for 24 hours if you were given a medicine to help you relax (sedative).  Do not lift anything that is heavier than 10 lb (4.5 kg) until your health care provider tells you that it is safe.  Avoid activities that take a lot of effort (are strenuous) until your health care provider approves. Most people will have to wait 2 weeks before returning to activities such as exercise or sexual intercourse. General instructions  Take over-the-counter and prescription medicines only as told by your health care provider.  You may eat and drink after your procedure. Follow instructions from your health care provider about eating or drinking restrictions.  Check your biopsy site every day for signs of infection. May remove dressing and bathe or shower in 24 hours. Replace dressing with bandaid as necessary. Check for: ? More redness, swelling, or pain. ? More fluid or blood. ? Warmth. ? Pus or a bad smell.  Keep all follow-up visits as told by your health care provider. This is important.  Use ice for discomfort.  Tylenol is okay if approved by your Dr.  Avoid ibuprofen for 24 hours. Contact a health care provider if:  You have more redness, swelling, or pain around your biopsy site.  You have more fluid or blood  coming from your biopsy site.  Your biopsy site feels warm to the touch.  You have pus or a bad smell coming from your biopsy site.  You have blood in your urine more than 24 hours after your procedure. Get help right away if:  You have dark red or brown urine.  You have a fever.  You are unable to urinate.  You feel burning when you urinate.  You feel faint.  You have severe pain in your abdomen or side. This information is not intended to replace advice given to you by your health care provider. Make sure you discuss any questions you have with your health care provider. Document Released: 07/01/2013 Document Revised: 10/11/2017 Document Reviewed: 08/10/2016 Elsevier Patient Education  2020 [facility] [phone] for concerns, after hours emergency [phone]  Moderate Conscious Sedation, Adult, Care After These instructions provide you with information about caring for yourself after your procedure. Your health care provider may also give you more specific instructions. Your treatment has been planned according to current medical practices, but problems sometimes occur. Call your health care provider if you have any problems or questions after your procedure. What can I expect after the procedure? After your procedure, it is common:  To feel sleepy for several hours.  To feel clumsy and have poor balance for several hours.  To have poor judgment for several hours.  To vomit if you eat too soon. Follow these instructions at home: For at least 24 hours after the procedure:  Do not: ? Participate in activities where you could fall or become injured. ? Drive. ? Use heavy machinery. ? Drink alcohol. ? Take sleeping pills or medicines that cause drowsiness. ? Make important decisions or sign legal documents. ? Take care of children on your own.  Rest. Eating and drinking  Follow the diet recommended by your health care provider.  If you  vomit: ? Drink water, juice, or soup when you can drink without vomiting. ? Make sure you have little or no nausea before eating solid foods. General instructions  Have a responsible adult stay with you until you are awake and alert.  Take over-the-counter and prescription medicines only as told by your health care provider.  If you smoke, do not smoke without supervision.  Keep all follow-up visits as told by your health care provider. This is important. Contact a health care provider if:  You keep feeling nauseous or you keep vomiting.  You feel light-headed.  You develop a rash.  You have a fever. Get help right away if:  You have trouble breathing. This information is not intended to replace advice given to you by your health care provider. Make sure you discuss any questions you have with your health care provider. Document Released: 08/19/2013 Document Revised: 10/11/2017 Document Reviewed: 02/18/2016 Elsevier Patient Education  2020 [employer].

## 2019-06-01 NOTE — Consult Note (Signed)
Chief Complaint: Patient was seen in consultation today for CT guided left adrenal mass biopsy  Referring Physician(s): Herrick,Benjamin W  Supervising Physician: Jacqulynn Cadet    Patient Status: [facility] - Out-pt  History of Present Illness: Latoya Mcfarland is a 73 y.o. female , ex smoker, with history of elevated alkaline phosphatase, abdominal/back discomfort and recent imaging revealing right retrohilar mass with associated lymphadenopathy, scattered bilateral pulmonary nodules as well as large left suprarenal/adrenal mass.  She has no known history of cancer.  She has  a negative metanephrine study.  She has had a positive Hemoccult.  She presents today for CT-guided left adrenal mass biopsy for further evaluation.  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 Authorizing Provider  ALPRAZolam (XANAX) 0.25 MG tablet Take 1 tablet (0.25 mg total) by mouth 2 (two) times daily as needed for anxiety. 04/08/19  Yes Orma Flaming, MD  aspirin EC 81 MG tablet Take 81 mg by mouth daily.   Yes [provider]  Cholecalciferol (VITAMIN D3 PO) Take by mouth daily.   Yes [provider]  lisinopril (ZESTRIL) 30 MG tablet Take 1 tablet (30 mg total) by mouth daily. 04/08/19  Yes Orma Flaming, MD  Magnesium 400 MG TABS Take by mouth daily.   Yes [provider]  Multiple Vitamin (MULTIVITAMIN) tablet Take 1 tablet by mouth daily.    Yes [provider]  Multiple Vitamins-Minerals (PRESERVISION AREDS 2 PO) Take by mouth 2 (two) times a day.   Yes [provider]  omeprazole (PRILOSEC) 40 MG capsule  07/15/18  Yes [provider]  rosuvastatin (CRESTOR) 20 MG tablet Take 20 mg by mouth daily.   Yes [provider]     Family History  Problem Relation Age of Onset   COPD Mother    Heart attack Father    Stroke Brother    Colon cancer Neg Hx    Colon polyps Neg Hx    Esophageal cancer Neg Hx    Stomach cancer Neg Hx    Rectal cancer Neg Hx     Social History   Socioeconomic History   Marital status: Widowed    Spouse name: Not on file   Number of children: Not on file   Years of education: Not on file   Highest education level: Not on file  Occupational History   Not on file  Social Needs   Financial resource strain: Not on file   Food insecurity    Worry: Not on file    Inability: Not on file   Transportation needs    Medical: Not on file    Non-medical: Not on file  Tobacco Use   Smoking status: Former Smoker    Packs/day: 1.00    Years: 50.00    Pack years: 50.00    Types: Cigarettes    Quit date: 04/19/2019    Years  since quitting: 0.1   Smokeless tobacco: Never Used  Substance and Sexual Activity   Alcohol use: Not Currently   Drug use: Never   Sexual activity: Not Currently  Lifestyle   Physical activity    Days per week: Not on file    Minutes per session: Not on file   Stress: Not on file  Relationships   Social connections    Talks on phone: Not on file    Gets together: Not on file    Attends religious service: Not on file    Active member of club or organization: Not on file    Attends meetings of clubs or organizations: Not on file    Relationship status: Not on file  Other Topics Concern   Not on file  Social History Narrative   Not on file      Review of Systems she denies fever, headache, chest pain,  cough, abnormal bleeding.  She does have abdominal/back discomfort, some dyspnea with exertion as well as intermittent nausea.  Vital Signs: BP (!) 156/68    Pulse 81    Temp 98.3 F (36.8 C) (Oral)    Resp 20    Ht 4\' 11"  (1.499 m)    Wt 130 lb (59 kg)    LMP  (LMP Unknown)    SpO2 99%    BMI 26.26 kg/m   Physical Exam awake, alert.  Chest clear to auscultation bilaterally.  Heart with regular rate and rhythm.  Abdomen soft, positive bowel sounds, some mild diffuse tenderness to palpation.  Extremities with full range of motion, no sig edema.  Imaging: Ct Chest W Contrast  Result Date: 05/12/2019 CLINICAL DATA:  Hilar mass. EXAM: CT CHEST WITH CONTRAST TECHNIQUE: Multidetector CT imaging of the chest was performed during intravenous contrast administration. CONTRAST:  64mL OMNIPAQUE IOHEXOL 300 MG/ML  SOLN COMPARISON:  MRI 05/04/2019 FINDINGS: Cardiovascular: The heart size is normal. No substantial pericardial effusion. Atherosclerotic calcification is noted in the wall of the thoracic aorta. Mediastinum/Nodes: Calcified lymph nodes are seen in the mediastinum and left hilum. 2.0 cm short axis right hilar node visible on 61/3. 11 mm short axis inferior right hilar node visible on 68/3. The esophagus has normal imaging features. There is no axillary lymphadenopathy. Lungs/Pleura: 4.4 x 2.9 x 4.9 cm irregular right retro hilar mass lesion is identified with areas of central cavitation. This lesion involves and crosses the major fissure (sagittal image 66/series 7) and is contiguous with the medial pleura. 4 mm right lower lobe nodule is identified on 58/4. Another tiny 2-3 mm right lower lobe nodule is visible on 61. 5 mm right lower lobe nodule identified on 98/4. 4 mm left lower lobe nodule identified on 84/4. 4 mm left upper lobe nodule evident on 23/4. Additional scattered 3-4 mm pulmonary nodules are evident. Scattered small foci of ground-glass attenuation are seen in the lungs bilaterally. No  pleural effusions. Upper Abdomen: Large left suprarenal mass was better characterized on recent abdominal MRI. Gallbladder surgically absent. Musculoskeletal: No worrisome lytic or sclerotic osseous abnormality. IMPRESSION: 1. 4 x 3 x 5 cm irregular right retro hilar lesion is highly suspicious for neoplasm. Primary bronchogenic carcinoma is a distinct concern. Metastatic lymphadenopathy is noted in the right hilum. 2. Scattered bilateral pulmonary nodules measuring up to 5 mm. Metastatic disease not excluded. 3. Large left suprarenal mass better characterized on the recent MRI. 4.  Aortic Atherosclerois (ICD10-170.0) Electronically Signed   By: Misty Stanley M.D.   On: 05/12/2019  15:00   Mr 3d Recon At Scanner  Result Date: 05/04/2019 CLINICAL DATA:  Elevated alkaline phosphatase level. Abdominal pain. EXAM: MRI ABDOMEN WITHOUT AND WITH CONTRAST (INCLUDING MRCP) TECHNIQUE: Multiplanar multisequence MR imaging of the abdomen was performed both before and after the administration of intravenous contrast. Heavily T2-weighted images of the biliary and pancreatic ducts were obtained, and three-dimensional MRCP images were rendered by post processing. CONTRAST:  6 cc Gadavist COMPARISON:  Abdominal ultrasound 04/28/2019 FINDINGS: Lower chest: Seen only on the coronal post-contrast series number 13, there is suspicion for a possible right hilar mass or lymph node measuring about 2.3 by 1.9 cm on image 39/13. This is near the pulmonary artery and strictly speaking I cannot completely exclude right-sided pulmonary embolus, but I favor adenopathy or mass. Hepatobiliary: Very minimal dropout of signal in the liver suggesting minimal hepatic steatosis. Cholecystectomy. There is no significant intrahepatic biliary dilatation. The common hepatic duct measures up to 8 mm diameter and the common bile duct up to 9 mm in diameter without filling defect. There is potentially a 6 mm hemangioma in the lateral segment left hepatic  lobe on image 52/1205, versus a small vascular malformation. Pancreas: The left suprarenal mass partially abuts the pancreas but is not thought to arise from the pancreas. Overall the pancreas appears unremarkable. Spleen:  Unremarkable Adrenals/Urinary Tract: A left suprarenal mass which I favor is originating from the left adrenal gland but which has significant abutment against the left kidney measures 8.7 by 5.9 by 8.7 cm (volume = 230 cm^3) and has generally intermediate precontrast T1 signal characteristics but heterogeneous speckled high and low T2 signal characteristics. This lesion demonstrates mildly heterogeneous progressive enhancement and rim enhancement and effaces the left adrenal gland. There appears to be some significant restriction of diffusion. I do not observe significant fax signal intensity within the lesion on the non fat saturated T1 weighted images. There may be some faint elements of dropout of signal on out of phase images compared to in phase images, but this is mild. For example on image 58/10 and 57/10, the inphase images in the region of interest have a signal intensity of 285 and the out of phase images, 255. Two tiny right renal cysts. Stomach/Bowel: Unremarkable Vascular/Lymphatic:  Aortoiliac atherosclerotic vascular disease. Other:  No supplemental non-categorized findings. Musculoskeletal: Unremarkable IMPRESSION: 1. There is a large left suprarenal mass measuring about 230 cubic cm in volume. This abuts the kidney and pancreas but I believe it is probably arising from the adrenal gland. Equivocal dropout of signal on out of phase images, but degree of internal heterogeneity and size would be atypical for adrenal adenoma. Other possibilities include metastatic lung cancer, adrenaocortical carcinoma, and pheochromocytoma/paraganglioma. 2. Included on only the coronal post-contrast series, there is suspicion for a possible right hilar mass. CT of the chest with and without  contrast is recommended. If this turns out to be a mass, the likelihood of the left adrenal mass being from metastatic lung cancer would be increased. 3. Depending on the findings at chest CT, FDG nuclear medicine PET scan may be warranted to further characterize. If alternatively clinical indicators seem to suggest pheochromocytoma/paraganglioma, then DOTATATE PET might alternatively be considered. 4. Minimal hepatic steatosis. 5. CBD up to 0.9 cm without filling defect. This is mildly dilated but probably a physiologic response to cholecystectomy. 6.  Aortic Atherosclerosis (ICD10-I70.0). Electronically Signed   By: Van Clines M.D.   On: 05/04/2019 15:35   Mr Abdomen Mrcp W Wo Contast  Result Date: 05/04/2019 CLINICAL DATA:  Elevated alkaline phosphatase level. Abdominal pain. EXAM: MRI ABDOMEN WITHOUT AND WITH CONTRAST (INCLUDING MRCP) TECHNIQUE: Multiplanar multisequence MR imaging of the abdomen was performed both before and after the administration of intravenous contrast. Heavily T2-weighted images of the biliary and pancreatic ducts were obtained, and three-dimensional MRCP images were rendered by post processing. CONTRAST:  6 cc Gadavist COMPARISON:  Abdominal ultrasound 04/28/2019 FINDINGS: Lower chest: Seen only on the coronal post-contrast series number 13, there is suspicion for a possible right hilar mass or lymph node measuring about 2.3 by 1.9 cm on image 39/13. This is near the pulmonary artery and strictly speaking I cannot completely exclude right-sided pulmonary embolus, but I favor adenopathy or mass. Hepatobiliary: Very minimal dropout of signal in the liver suggesting minimal hepatic steatosis. Cholecystectomy. There is no significant intrahepatic biliary dilatation. The common hepatic duct measures up to 8 mm diameter and the common bile duct up to 9 mm in diameter without filling defect. There is potentially a 6 mm hemangioma in the lateral segment left hepatic lobe on image  52/1205, versus a small vascular malformation. Pancreas: The left suprarenal mass partially abuts the pancreas but is not thought to arise from the pancreas. Overall the pancreas appears unremarkable. Spleen:  Unremarkable Adrenals/Urinary Tract: A left suprarenal mass which I favor is originating from the left adrenal gland but which has significant abutment against the left kidney measures 8.7 by 5.9 by 8.7 cm (volume = 230 cm^3) and has generally intermediate precontrast T1 signal characteristics but heterogeneous speckled high and low T2 signal characteristics. This lesion demonstrates mildly heterogeneous progressive enhancement and rim enhancement and effaces the left adrenal gland. There appears to be some significant restriction of diffusion. I do not observe significant fax signal intensity within the lesion on the non fat saturated T1 weighted images. There may be some faint elements of dropout of signal on out of phase images compared to in phase images, but this is mild. For example on image 58/10 and 57/10, the inphase images in the region of interest have a signal intensity of 285 and the out of phase images, 255. Two tiny right renal cysts. Stomach/Bowel: Unremarkable Vascular/Lymphatic:  Aortoiliac atherosclerotic vascular disease. Other:  No supplemental non-categorized findings. Musculoskeletal: Unremarkable IMPRESSION: 1. There is a large left suprarenal mass measuring about 230 cubic cm in volume. This abuts the kidney and pancreas but I believe it is probably arising from the adrenal gland. Equivocal dropout of signal on out of phase images, but degree of internal heterogeneity and size would be atypical for adrenal adenoma. Other possibilities include metastatic lung cancer, adrenaocortical carcinoma, and pheochromocytoma/paraganglioma. 2. Included on only the coronal post-contrast series, there is suspicion for a possible right hilar mass. CT of the chest with and without contrast is  recommended. If this turns out to be a mass, the likelihood of the left adrenal mass being from metastatic lung cancer would be increased. 3. Depending on the findings at chest CT, FDG nuclear medicine PET scan may be warranted to further characterize. If alternatively clinical indicators seem to suggest pheochromocytoma/paraganglioma, then DOTATATE PET might alternatively be considered. 4. Minimal hepatic steatosis. 5. CBD up to 0.9 cm without filling defect. This is mildly dilated but probably a physiologic response to cholecystectomy. 6.  Aortic Atherosclerosis (ICD10-I70.0). Electronically Signed   By: Van Clines M.D.   On: 05/04/2019 15:35    Labs:  CBC: Recent Labs    10/13/18 1038 04/08/19 0839 05/25/19 1353 06/01/19  0748  WBC 10.1 10.4 11.2* 12.8*  HGB 11.5* 10.9* 9.9* 9.9*  HCT 33.9* 32.1* 29.4* 31.5*  PLT 320.0 344.0 319 275    COAGS: No results for input(s): INR, APTT in the last 8760 hours.  BMP: Recent Labs    10/13/18 1222 04/08/19 0839 05/25/19 1353 06/01/19 0748  NA 139 137 139 139  K 4.3 5.3 No hemolysis seen* 4.3 5.0  CL 105 102 108 105  CO2 23 25 22  21*  GLUCOSE 114* 103* 98 116*  BUN 24* 24* 27* 24*  CALCIUM 9.9 9.9 9.3 9.7  CREATININE 1.03 1.02 1.14* 1.06*  GFRNONAA  --   --  48* 52*  GFRAA  --   --  56* >60    LIVER FUNCTION TESTS: Recent Labs    10/13/18 1222 04/08/19 0839 04/20/19 1103 05/25/19 1353 06/01/19 0748  BILITOT 0.4 0.5  --  0.2* 0.5  AST 16 17  --  15 22  ALT 16 14  --  13 17  ALKPHOS 198* 225* 234* 244* 227*  PROT 6.7 7.1  --  7.1 7.1  ALBUMIN 4.5 4.3  --  4.0 4.0    TUMOR MARKERS: No results for input(s): AFPTM, CEA, CA199, CHROMGRNA in the last 8760 hours.  Assessment and Plan: 73 y.o. female , ex smoker, with history of elevated alkaline phosphatase, abdominal/back discomfort and recent imaging revealing right retrohilar mass with associated lymphadenopathy, scattered bilateral pulmonary nodules as well as  large left suprarenal/adrenal mass.  She has no known history of cancer.  She has  a negative metanephrine study.  She has had a positive Hemoccult.  She presents today for CT-guided left adrenal mass biopsy for further evaluation.Risks and benefits of procedure was discussed with the patient  including, but not limited to bleeding, infection, damage to adjacent structures or low yield requiring additional tests.  All of the questions were answered and there is agreement to proceed.  Consent signed and in chart.     Thank you for this interesting consult.  I greatly enjoyed meeting Latoya Mcfarland and look forward to participating in their care.  A copy of this report was sent to the requesting provider on this date.  Electronically Signed: D. Rowe Robert, PA-C 06/01/2019, 8:14 AM   I spent a total of 25 minutes    in face to face in clinical consultation, greater than 50% of which was counseling/coordinating care for CT-guided left adrenal mass biopsy

## 2019-06-01 NOTE — Procedures (Signed)
Interventional Radiology Procedure Note  Procedure: CT guided biopsy of left adrenal mass.   Complications: None  Estimated Blood Loss: None  Recommendations: - Bedrest x 2 hrs - DC home  Signed,  Criselda Peaches, MD

## 2019-06-02 ENCOUNTER — Ambulatory Visit (HOSPITAL_COMMUNITY)
Admission: RE | Admit: 2019-06-02 | Discharge: 2019-06-02 | Disposition: A | Discharge status: Home / Self Care | Payer: Medicare Other | Source: Ambulatory Visit | Attending: Internal Medicine | Admitting: Internal Medicine

## 2019-06-02 DIAGNOSIS — I700 Atherosclerosis of aorta: Secondary | ICD-10-CM | POA: Insufficient documentation

## 2019-06-02 DIAGNOSIS — E279 Disorder of adrenal gland, unspecified: Secondary | ICD-10-CM | POA: Diagnosis not present

## 2019-06-02 DIAGNOSIS — R590 Localized enlarged lymph nodes: Secondary | ICD-10-CM | POA: Insufficient documentation

## 2019-06-02 DIAGNOSIS — K5730 Diverticulosis of large intestine without perforation or abscess without bleeding: Secondary | ICD-10-CM | POA: Diagnosis not present

## 2019-06-02 DIAGNOSIS — J320 Chronic maxillary sinusitis: Secondary | ICD-10-CM | POA: Insufficient documentation

## 2019-06-02 DIAGNOSIS — C3490 Malignant neoplasm of unspecified part of unspecified bronchus or lung: Secondary | ICD-10-CM | POA: Insufficient documentation

## 2019-06-02 DIAGNOSIS — I2510 Atherosclerotic heart disease of native coronary artery without angina pectoris: Secondary | ICD-10-CM | POA: Insufficient documentation

## 2019-06-02 DIAGNOSIS — J439 Emphysema, unspecified: Secondary | ICD-10-CM | POA: Insufficient documentation

## 2019-06-02 LAB — GLUCOSE, CAPILLARY: Glucose-Capillary: 95 mg/dL (ref 70–99)

## 2019-06-02 MED ORDER — FLUDEOXYGLUCOSE F - 18 (FDG) INJECTION
6.9000 | Freq: Once | INTRAVENOUS | Status: AC | PRN
  Administered 2019-06-02: 6.9 via INTRAVENOUS

## 2019-06-04 ENCOUNTER — Encounter: Payer: Self-pay | Admitting: *Deleted

## 2019-06-04 ENCOUNTER — Other Ambulatory Visit: Payer: Self-pay

## 2019-06-04 ENCOUNTER — Inpatient Hospital Stay: Payer: Medicare Other

## 2019-06-04 ENCOUNTER — Inpatient Hospital Stay (HOSPITAL_BASED_OUTPATIENT_CLINIC_OR_DEPARTMENT_OTHER): Payer: Medicare Other | Admitting: Internal Medicine

## 2019-06-04 ENCOUNTER — Encounter: Payer: Self-pay | Admitting: Internal Medicine

## 2019-06-04 ENCOUNTER — Other Ambulatory Visit: Payer: Medicare Other

## 2019-06-04 ENCOUNTER — Telehealth: Payer: Self-pay | Admitting: Internal Medicine

## 2019-06-04 DIAGNOSIS — C3431 Malignant neoplasm of lower lobe, right bronchus or lung: Secondary | ICD-10-CM | POA: Diagnosis not present

## 2019-06-04 DIAGNOSIS — C7972 Secondary malignant neoplasm of left adrenal gland: Secondary | ICD-10-CM

## 2019-06-04 DIAGNOSIS — J984 Other disorders of lung: Secondary | ICD-10-CM

## 2019-06-04 DIAGNOSIS — I10 Essential (primary) hypertension: Secondary | ICD-10-CM | POA: Diagnosis not present

## 2019-06-04 DIAGNOSIS — Z7982 Long term (current) use of aspirin: Secondary | ICD-10-CM | POA: Diagnosis not present

## 2019-06-04 DIAGNOSIS — Z79899 Other long term (current) drug therapy: Secondary | ICD-10-CM | POA: Diagnosis not present

## 2019-06-04 DIAGNOSIS — Z87891 Personal history of nicotine dependence: Secondary | ICD-10-CM | POA: Diagnosis not present

## 2019-06-04 DIAGNOSIS — C3491 Malignant neoplasm of unspecified part of right bronchus or lung: Secondary | ICD-10-CM | POA: Diagnosis not present

## 2019-06-04 DIAGNOSIS — Z7189 Other specified counseling: Secondary | ICD-10-CM

## 2019-06-04 DIAGNOSIS — Z5111 Encounter for antineoplastic chemotherapy: Secondary | ICD-10-CM | POA: Insufficient documentation

## 2019-06-04 DIAGNOSIS — C3490 Malignant neoplasm of unspecified part of unspecified bronchus or lung: Secondary | ICD-10-CM

## 2019-06-04 NOTE — Progress Notes (Signed)
Oncology Nurse Navigator Documentation  Oncology Nurse Navigator Flowsheets 06/04/2019  Navigator Location [facility]  Navigator Encounter Type Clinic/MDC/I spoke with patient at clinic today.  She has new diagnosis of lung cancer.  I gave and explained information on lung cancer.  Dr. Julien Nordmann requested patient have Guardant 360.  I completed paper work and order.  Dr. Julien Nordmann is also requesting Foundation one and PDL 1 to be sent on recent biopsy.  I will contact pathology with request.   Abnormal Finding Date 05/04/2019  Confirmed Diagnosis Date 06/01/2019  Patient Visit Type MedOnc  Treatment Phase Pre-Tx/Tx Discussion  Barriers/Navigation Needs Coordination of Care;Education  Education Other  Interventions Coordination of Care;Education  Coordination of Care Appts;Other  Education Method Verbal;Written  Support Groups/Services Other  Acuity Level 2  Time Spent with Patient 30

## 2019-06-04 NOTE — Telephone Encounter (Signed)
Scheduled appt per 7/23 los.  Printed calendar and avs.

## 2019-06-04 NOTE — Progress Notes (Signed)
[place] Telephone:([phone]   Fax:([phone]  OFFICE PROGRESS NOTE  Orma Flaming, MD [place] [address]  DIAGNOSIS: Stage IV (T2b, N1, M1 C) non-small cell lung cancer, adenocarcinoma presented with right upper lobe retro-hilar and suprahilar mass in addition to right hilar lymphadenopathy as well as small pulmonary nodules and metastatic disease to the left adrenal gland as well as small lymph node just above the celiac trunk diagnosed in July 2020.    PRIOR THERAPY: None  CURRENT THERAPY: None  INTERVAL HISTORY: Latoya Mcfarland 73 y.o. female returns to the clinic today for follow-up visit.  The patient is feeling fine today with no concerning complaints except for pain in the left flank area and left upper quadrant.  This is likely secondary to the enlarged left adrenal gland.  She denied having any chest pain, shortness of breath, cough or hemoptysis.  She denied having any fever or chills.  She has no nausea, vomiting, diarrhea or constipation.  She denied having any headache or visual changes.  She underwent CT-guided core biopsy of the left adrenal gland mass and the final pathology was consistent with adenocarcinoma of lung primary.  She also had a PET scan performed recently and she is here today for evaluation and discussion of her treatment options based on the new findings.  MEDICAL HISTORY: Past Medical History:  Diagnosis Date   Anemia    Anxiety    Benign neoplasm of kidney 05/06/2019   Depression    Diverticulitis    GERD (gastroesophageal reflux disease)    Heart murmur    History of chicken pox    Hyperlipidemia    Hypertension     ALLERGIES:  is allergic to atorvastatin and lescol [fluvastatin].  MEDICATIONS:  Current Outpatient Medications  Medication Sig Dispense Refill   ALPRAZolam (XANAX) 0.25 MG tablet Take 1 tablet (0.25 mg total) by mouth 2 (two) times daily as needed for anxiety. 60  tablet 0   aspirin EC 81 MG tablet Take 81 mg by mouth daily.     Cholecalciferol (VITAMIN D3 PO) Take by mouth daily.     lisinopril (ZESTRIL) 30 MG tablet Take 1 tablet (30 mg total) by mouth daily. 90 tablet 1   Magnesium 400 MG TABS Take by mouth daily.     Multiple Vitamin (MULTIVITAMIN) tablet Take 1 tablet by mouth daily.     Multiple Vitamins-Minerals (PRESERVISION AREDS 2 PO) Take by mouth 2 (two) times a day.     omeprazole (PRILOSEC) 40 MG capsule      rosuvastatin (CRESTOR) 20 MG tablet Take 20 mg by mouth daily.     No current facility-administered medications for this visit.     SURGICAL HISTORY:  Past Surgical History:  Procedure Laterality Date   ABDOMINAL HYSTERECTOMY  1992   APPENDECTOMY  1961   CHOLECYSTECTOMY  2000   TONSILLECTOMY AND ADENOIDECTOMY  1959    REVIEW OF SYSTEMS:  Constitutional: positive for fatigue Eyes: negative Ears, nose, mouth, throat, and face: negative Respiratory: negative Cardiovascular: negative Gastrointestinal: positive for abdominal pain Genitourinary:negative Integument/breast: negative Hematologic/lymphatic: negative Musculoskeletal:negative Neurological: negative Behavioral/Psych: negative Endocrine: negative Allergic/Immunologic: negative   PHYSICAL EXAMINATION: General appearance: alert, cooperative, fatigued and no distress Head: Normocephalic, without obvious abnormality, atraumatic Neck: no adenopathy, no JVD, supple, symmetrical, trachea midline and thyroid not enlarged, symmetric, no tenderness/mass/nodules Lymph nodes: Cervical, supraclavicular, and axillary nodes normal. Resp: clear to auscultation bilaterally Back: symmetric, no curvature. ROM  normal. No CVA tenderness. Cardio: regular rate and rhythm, S1, S2 normal, no murmur, click, rub or gallop GI: soft, non-tender; bowel sounds normal; no masses,  no organomegaly Extremities: extremities normal, atraumatic, no cyanosis or edema Neurologic: Alert  and oriented X 3, normal strength and tone. Normal symmetric reflexes. Normal coordination and gait  ECOG PERFORMANCE STATUS: 1 - Symptomatic but completely ambulatory  Blood pressure 134/61, pulse 84, temperature 97.9 F (36.6 C), temperature source Temporal, resp. rate 17, height 4\' 11"  (1.499 m), weight 133 lb 1.6 oz (60.4 kg), SpO2 100 %.  LABORATORY DATA: Lab Results  Component Value Date   WBC 12.8 (H) 06/01/2019   HGB 9.9 (L) 06/01/2019   HCT 31.5 (L) 06/01/2019   MCV 89.2 06/01/2019   PLT 275 06/01/2019      Chemistry      Component Value Date/Time   NA 139 06/01/2019 0748   NA 141 01/08/2018   K 5.0 06/01/2019 0748   CL 105 06/01/2019 0748   CO2 21 (L) 06/01/2019 0748   BUN 24 (H) 06/01/2019 0748   CREATININE 1.06 (H) 06/01/2019 0748   CREATININE 1.14 (H) 05/25/2019 1353   GLU 102 01/08/2018      Component Value Date/Time   CALCIUM 9.7 06/01/2019 0748   ALKPHOS 227 (H) 06/01/2019 0748   AST 22 06/01/2019 0748   AST 15 05/25/2019 1353   ALT 17 06/01/2019 0748   ALT 13 05/25/2019 1353   BILITOT 0.5 06/01/2019 0748   BILITOT 0.2 (L) 05/25/2019 1353       RADIOGRAPHIC STUDIES: Ct Chest W Contrast  Result Date: 05/12/2019 CLINICAL DATA:  Hilar mass. EXAM: CT CHEST WITH CONTRAST TECHNIQUE: Multidetector CT imaging of the chest was performed during intravenous contrast administration. CONTRAST:  89mL OMNIPAQUE IOHEXOL 300 MG/ML  SOLN COMPARISON:  MRI 05/04/2019 FINDINGS: Cardiovascular: The heart size is normal. No substantial pericardial effusion. Atherosclerotic calcification is noted in the wall of the thoracic aorta. Mediastinum/Nodes: Calcified lymph nodes are seen in the mediastinum and left hilum. 2.0 cm short axis right hilar node visible on 61/3. 11 mm short axis inferior right hilar node visible on 68/3. The esophagus has normal imaging features. There is no axillary lymphadenopathy. Lungs/Pleura: 4.4 x 2.9 x 4.9 cm irregular right retro hilar mass lesion is  identified with areas of central cavitation. This lesion involves and crosses the major fissure (sagittal image 66/series 7) and is contiguous with the medial pleura. 4 mm right lower lobe nodule is identified on 58/4. Another tiny 2-3 mm right lower lobe nodule is visible on 61. 5 mm right lower lobe nodule identified on 98/4. 4 mm left lower lobe nodule identified on 84/4. 4 mm left upper lobe nodule evident on 23/4. Additional scattered 3-4 mm pulmonary nodules are evident. Scattered small foci of ground-glass attenuation are seen in the lungs bilaterally. No pleural effusions. Upper Abdomen: Large left suprarenal mass was better characterized on recent abdominal MRI. Gallbladder surgically absent. Musculoskeletal: No worrisome lytic or sclerotic osseous abnormality. IMPRESSION: 1. 4 x 3 x 5 cm irregular right retro hilar lesion is highly suspicious for neoplasm. Primary bronchogenic carcinoma is a distinct concern. Metastatic lymphadenopathy is noted in the right hilum. 2. Scattered bilateral pulmonary nodules measuring up to 5 mm. Metastatic disease not excluded. 3. Large left suprarenal mass better characterized on the recent MRI. 4.  Aortic Atherosclerois (ICD10-170.0) Electronically Signed   By: Misty Stanley M.D.   On: 05/12/2019 15:00   Nm Pet Image Initial (pi)  Skull Base To Thigh  Result Date: 06/02/2019 CLINICAL DATA:  Initial treatment strategy for non-small cell lung cancer. EXAM: NUCLEAR MEDICINE PET SKULL BASE TO THIGH TECHNIQUE: 6.9 mCi F-18 FDG was injected intravenously. Full-ring PET imaging was performed from the skull base to thigh after the radiotracer. CT data was obtained and used for attenuation correction and anatomic localization. Fasting blood glucose: 95 mg/dl COMPARISON:  CT chest from 05/12/2019 FINDINGS: Mediastinal blood pool activity: SUV max 2.7 Liver activity: SUV max NA NECK: No significant abnormal hypermetabolic activity in this region. Incidental CT findings: Mild  chronic left maxillary sinusitis. Bilateral common carotid atherosclerotic vascular disease. CHEST: Right upper lobe retro hilar and suprahilar mass approximately 4.1 by 3.2 cm on image 25/8 with some central necrosis and central cavitation, maximum SUV 19.8. Right hilar lymph node approximately 1.3 cm in diameter, maximum SUV 12.6, compatible with malignancy. Small bilateral pulmonary nodules are present. Flow most of these are too small to characterize and not perceptibly hypermetabolic, a 0.5 cm left apically pulmonary nodule on image 12/8 has maximum SUV of 1.7. Given the small size of the nodule the appearance is most compatible with malignancy in this setting. Incidental CT findings: Centrilobular emphysema. Old granulomatous disease. Coronary, aortic arch, and branch vessel atherosclerotic vascular disease. ABDOMEN/PELVIS: Approximately 11.3 by 5.9 by 6.6 cm left adrenal mass is highly hypermetabolic. Maximum SUV 15.0. This abuts the kidney, and the pancreatic tail. Just above the celiac trunk, a 0.6 cm in short axis lymph node has maximum SUV of 5.4 compatible with malignant involvement. Accentuated activity in the antropyloric region, likely physiologic. Incidental CT findings: Cholecystectomy. Aortoiliac atherosclerotic vascular disease. Mild descending and sigmoid colon diverticulosis. SKELETON: No significant abnormal hypermetabolic activity in this region. Incidental CT findings: none IMPRESSION: 1. Right upper lobe retro hilar and suprahilar mass is highly hypermetabolic with maximum SUV 19.8. 2. Hypermetabolic enlarged right hilar lymph node, maximum SUV 12.6. 3. Most of the small pulmonary nodules in both lungs are too small to characterize. However, a left upper lobe 5 mm nodule demonstrates hypermetabolic activity relative to its size, and accordingly the small nodules are suspicious for metastatic lesions. 4. Large left adrenal mass is highly hypermetabolic, maximum SUV 09.9, compatible with  malignancy. 5. A small lymph node just above the celiac trunk is hypermetabolic, compatible with malignancy. 6. Other imaging findings of potential clinical significance: Aortic Atherosclerosis (ICD10-I70.0) and Emphysema (ICD10-J43.9). Coronary atherosclerosis. Descending and sigmoid colon diverticulosis. Mild chronic left maxillary sinusitis. Electronically Signed   By: Van Clines M.D.   On: 06/02/2019 14:51   Ct Biopsy  Result Date: 06/01/2019 INDICATION: 73 year old female with left adrenal mass. She presents for biopsy to facilitate tissue diagnosis. EXAM: CT BIOPSY MEDICATIONS: None. ANESTHESIA/SEDATION: Moderate (conscious) sedation was employed during this procedure. A total of Versed 2 mg and Fentanyl 100 mcg was administered intravenously. Moderate Sedation Time: 12 minutes. The patient's level of consciousness and vital signs were monitored continuously by radiology nursing throughout the procedure under my direct supervision. FLUOROSCOPY TIME:  None. COMPLICATIONS: None immediate. PROCEDURE: Informed written consent was obtained from the patient after a thorough discussion of the procedural risks, benefits and alternatives. All questions were addressed. A timeout was performed prior to the initiation of the procedure. A planning axial CT scan was performed. The left adrenal mass was successfully localized. The patient was placed in the left lateral decubitus position to facilitate hypoinflation of the left lung and allowed for a safe window without transgressing the pleura. Repeat imaging was  performed. A suitable skin entry site was selected and marked. The overlying skin was sterilely prepped and draped in the standard fashion using chlorhexidine skin prep. Local anesthesia was attained by infiltration with 1% lidocaine. A small dermatotomy was made. Under intermittent CT guidance, a 17 gauge introducer needle was advanced and positioned in the margin of the mass. Multiple 18 gauge core  biopsies were then coaxially obtained using the bio Pince automated biopsy device. Biopsy specimens were placed in formalin and delivered to pathology for further analysis. As the introducer needle was removed, the biopsy tract was embolized with a Gel-Foam slurry. Post biopsy axial CT imaging demonstrates no evidence of immediate complication. The patient remained normotensive throughout the procedure. IMPRESSION: Technically successful CT-guided core biopsy of left adrenal mass. Electronically Signed   By: Jacqulynn Cadet M.D.   On: 06/01/2019 10:57    ASSESSMENT AND PLAN: This is a very pleasant 73 years old white female recently diagnosed with a stage IV (T2b, N1, M1 C) non-small cell lung cancer, adenocarcinoma presented with large right upper lobe lung mass in addition to right hilar lymphadenopathy and very large metastatic lesion to the left adrenal gland as well as abdominal lymph node.  This was diagnosed in July 2020. The patient had a recent PET scan.  I personally and independently reviewed the scan images and discussed the result and showed the images to the patient today. I recommended for her to complete the staging work-up by ordering MRI of the brain to rule out brain metastasis. I also recommended for the patient to send her tissue for molecular studies and PDL 1 expression by [facility] in addition to blood tests for molecular studies by [facility] [employer]. I discussed with the patient her treatment options including palliative care versus systemic chemotherapy with carboplatin, Alimta and Keytruda if she has no evidence of actionable mutations versus treatment with targeted therapy if the molecular studies are positive for an actionable mutation. The patient indicated today that she may be interested more in the palliative and hospice care option but she would wait until we have the full picture with the molecular studies and MRI of the brain before making a final decision. I will  see her back for follow-up visit in around 10 days for reevaluation and discussion of her treatment options. The patient was advised to call immediately if she has any other concerning symptoms in the interval. The patient voices understanding of current disease status and treatment options and is in agreement with the current care plan.  All questions were answered. The patient knows to call the clinic with any problems, questions or concerns. We can certainly see the patient much sooner if necessary.  I spent 15 minutes counseling the patient face to face. The total time spent in the appointment was 25 minutes.  Disclaimer: This note was dictated with voice recognition software. Similar sounding words can inadvertently be transcribed and may not be corrected upon review.

## 2019-06-09 ENCOUNTER — Ambulatory Visit: Payer: Medicare Other | Admitting: Pulmonary Disease

## 2019-06-09 ENCOUNTER — Encounter: Payer: Medicare Other | Admitting: Gastroenterology

## 2019-06-10 ENCOUNTER — Encounter: Payer: Self-pay | Admitting: *Deleted

## 2019-06-10 NOTE — Progress Notes (Signed)
Oncology Nurse Navigator Documentation  Oncology Nurse Navigator Flowsheets 06/10/2019  Diagnosis Status -  Navigator Follow Up Date: -  Navigator Follow Up Reason: -  Navigator Location [facility]  Navigator Encounter Type Other/I contacted [facility] [employer] to check on when Ms. Childrey test results will be completed.  I was updated that her results should be completed on 06/12/19.  She is to see Dr. Worthy Flank PA on 8/5 and I updated him on timing of appt and results.  No new orders at this time.    Abnormal Finding Date -  Confirmed Diagnosis Date -  Patient Visit Type -  Treatment Phase Pre-Tx/Tx Discussion  Barriers/Navigation Needs Coordination of Care  Education Other  Interventions Coordination of Care  Coordination of Care Other  Education Method -  Support Groups/Services -  Acuity Level 2  Time Spent with Patient 30

## 2019-06-11 ENCOUNTER — Ambulatory Visit (HOSPITAL_COMMUNITY)
Admission: RE | Admit: 2019-06-11 | Discharge: 2019-06-11 | Disposition: A | Discharge status: Home / Self Care | Payer: Medicare Other | Source: Ambulatory Visit | Attending: Internal Medicine | Admitting: Internal Medicine

## 2019-06-11 ENCOUNTER — Other Ambulatory Visit: Payer: Self-pay

## 2019-06-11 DIAGNOSIS — C3490 Malignant neoplasm of unspecified part of unspecified bronchus or lung: Secondary | ICD-10-CM | POA: Insufficient documentation

## 2019-06-11 DIAGNOSIS — G9389 Other specified disorders of brain: Secondary | ICD-10-CM | POA: Diagnosis not present

## 2019-06-11 DIAGNOSIS — H748X2 Other specified disorders of left middle ear and mastoid: Secondary | ICD-10-CM | POA: Diagnosis not present

## 2019-06-11 DIAGNOSIS — G319 Degenerative disease of nervous system, unspecified: Secondary | ICD-10-CM | POA: Diagnosis not present

## 2019-06-11 DIAGNOSIS — I6782 Cerebral ischemia: Secondary | ICD-10-CM | POA: Diagnosis not present

## 2019-06-11 MED ORDER — GADOBUTROL 1 MMOL/ML IV SOLN
6.0000 mL | Freq: Once | INTRAVENOUS | Status: AC | PRN
  Administered 2019-06-11: 6 mL via INTRAVENOUS

## 2019-06-15 ENCOUNTER — Encounter: Payer: Self-pay | Admitting: *Deleted

## 2019-06-15 NOTE — Progress Notes (Signed)
Oncology Nurse Navigator Documentation  Oncology Nurse Navigator Flowsheets 06/15/2019  Diagnosis Status -  Navigator Follow Up Date: -  Navigator Follow Up Reason: -  Navigator Location [facility]  Navigator Encounter Type -  Abnormal Finding Date -  Confirmed Diagnosis Date -  Patient Visit Type -  Treatment Phase Pre-Tx/Tx Discussion  Barriers/Navigation Needs Coordination of Care/I updated Dr. Julien Nordmann on [employer] test results.  Patient has a follow up appt with Dr. Julien Nordmann tomorrow.    Education -  Interventions Coordination of Care  Coordination of Care Other  Education Method -  Support Groups/Services -  Acuity Level 2  Time Spent with Patient 30

## 2019-06-16 ENCOUNTER — Encounter: Payer: Medicare Other | Admitting: Gastroenterology

## 2019-06-17 ENCOUNTER — Telehealth: Payer: Self-pay | Admitting: *Deleted

## 2019-06-17 ENCOUNTER — Other Ambulatory Visit: Payer: Self-pay

## 2019-06-17 ENCOUNTER — Inpatient Hospital Stay: Payer: Medicare Other | Attending: Physician Assistant | Admitting: Physician Assistant

## 2019-06-17 ENCOUNTER — Encounter: Payer: Self-pay | Admitting: Physician Assistant

## 2019-06-17 VITALS — BP 136/54 | HR 85 | Temp 98.7°F | Resp 18 | Ht 59.0 in | Wt 134.3 lb

## 2019-06-17 DIAGNOSIS — I10 Essential (primary) hypertension: Secondary | ICD-10-CM | POA: Diagnosis not present

## 2019-06-17 DIAGNOSIS — C3411 Malignant neoplasm of upper lobe, right bronchus or lung: Secondary | ICD-10-CM | POA: Insufficient documentation

## 2019-06-17 DIAGNOSIS — E278 Other specified disorders of adrenal gland: Secondary | ICD-10-CM | POA: Diagnosis not present

## 2019-06-17 DIAGNOSIS — I2510 Atherosclerotic heart disease of native coronary artery without angina pectoris: Secondary | ICD-10-CM | POA: Diagnosis not present

## 2019-06-17 DIAGNOSIS — G2581 Restless legs syndrome: Secondary | ICD-10-CM | POA: Diagnosis not present

## 2019-06-17 DIAGNOSIS — C3491 Malignant neoplasm of unspecified part of right bronchus or lung: Secondary | ICD-10-CM

## 2019-06-17 DIAGNOSIS — Z79899 Other long term (current) drug therapy: Secondary | ICD-10-CM | POA: Insufficient documentation

## 2019-06-17 DIAGNOSIS — J320 Chronic maxillary sinusitis: Secondary | ICD-10-CM | POA: Insufficient documentation

## 2019-06-17 DIAGNOSIS — C7972 Secondary malignant neoplasm of left adrenal gland: Secondary | ICD-10-CM | POA: Insufficient documentation

## 2019-06-17 DIAGNOSIS — Z7189 Other specified counseling: Secondary | ICD-10-CM | POA: Diagnosis not present

## 2019-06-17 NOTE — Telephone Encounter (Signed)
Faxed Hospice referral to [facility].

## 2019-06-17 NOTE — Progress Notes (Signed)
[facility] OFFICE PROGRESS NOTE  Orma Flaming, MD [place] [address]  DIAGNOSIS: Stage IV (T2b, N1, M1 C) non-small cell lung cancer, adenocarcinoma presented with right upper lobe retro-hilar and suprahilar mass in addition to right hilar lymphadenopathy as well as small pulmonary nodules and metastatic disease to the left adrenal gland as well as small lymph node just above the celiac trunk diagnosed in July 2020.    PRIOR THERAPY: None  CURRENT THERAPY: None  INTERVAL HISTORY: Latoya Mcfarland 73 y.o. female returns to the clinic for a follow-up visit.  The patient states that she has a sensation of generally not feeling well today and that she has lost interest/motivation in many things such as food. She also endorsed restless legs today and chest discomfort. The patient recently re-located to this area from [place] after the passing of her husband. The patient established care with her primary doctor in this area. She was continued on xanax for her anxiety and stress. The patient has been taking xanax for her restless legs as well which she states is not helping her. Instead, the xanax makes her feel "groggy" the next morning. Otherwise, she also endorsed chest discomfort. Her discomfort is predominantly in the sternal area or left anterior chest. When asked to describe her pain and if her pain was sharp, throbbing, achy, burning, etc, she stated that it can be characterized as "all of the above". She is unable to associate her pain with any particular activities such as eating, activity, etc. The pain is non-radiating and can last a variable amount of time. She is not experiencing any pain at this time.   The patient recently had a cologuard test performed which was positive. The patient was supposed to follow up with Dr. Fuller Plan from gastroenterology, however, she cancelled her appointments/further workup at this time due to her recent cancer  diagnosis.   Regarding her recent diagnosis with lung cancer, she denies any recent fever, chills, night sweats, or weight loss.  She denies any nausea, vomiting, diarrhea, constipation.  She denies any cough or hemoptysis but reports her baseline shortness of breath with exertion.  She denies any headache or visual changes. The patient was recently diagnosed with non-small cell lung cancer, adenocarcinoma.  She recently completed the staging work-up with a brain MRI.  She also had molecular studies performed by [facility] to assess if she is a candidate for oral targeted chemotherapy.  The patient indicated at her last appointment that depending on the results of her recent studies, that she may be interested in hospice.  She is here today for evaluation and to discuss her results and treatment options.  MEDICAL HISTORY: Past Medical History:  Diagnosis Date  . Anemia   . Anxiety   . 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 Depression   . Diverticulitis   . GERD (gastroesophageal reflux disease)   . Heart murmur   . History of chicken pox   . Hyperlipidemia   . Hypertension     ALLERGIES:  is allergic to atorvastatin and lescol [fluvastatin].  MEDICATIONS:  Current Outpatient Medications  Medication Sig Dispense Refill  . ALPRAZolam (XANAX) 0.25 MG tablet Take 1 tablet (0.25 mg total) by mouth 2 (two) times daily as needed for anxiety. 60 tablet 0  . aspirin EC 81 MG tablet Take 81 mg by mouth daily.    . Cholecalciferol (VITAMIN D3 PO) Take by mouth daily.    Marland Kitchen lisinopril (ZESTRIL) 30  MG tablet Take 1 tablet (30 mg total) by mouth daily. 90 tablet 1  . Magnesium 400 MG TABS Take by mouth daily.    . Multiple Vitamin (MULTIVITAMIN) tablet Take 1 tablet by mouth daily.    . Multiple Vitamins-Minerals (PRESERVISION AREDS 2 PO) Take by mouth 2 (two) times a day.    Marland Kitchen omeprazole (PRILOSEC) 40 MG capsule     . rosuvastatin (CRESTOR) 20 MG tablet Take 20 mg by mouth daily.     No current  facility-administered medications for this visit.     SURGICAL HISTORY:  Past Surgical History:  Procedure Laterality Date  . ABDOMINAL HYSTERECTOMY  1992  . APPENDECTOMY  1961  . CHOLECYSTECTOMY  2000  . TONSILLECTOMY AND ADENOIDECTOMY  1959    REVIEW OF SYSTEMS:   Review of Systems  Constitutional: Positive for appetite change. Negative for chills, fatigue, fever and unexpected weight change.  HENT:   Negative for mouth sores, nosebleeds, sore throat and trouble swallowing.   Eyes: Negative for eye problems and icterus.  Respiratory: Positive of baseline shortness of breath with exertion. Negative for cough, hemoptysis, and wheezing.   Cardiovascular: Positive for intermittent chest discomfort. Negative for leg swelling.  Gastrointestinal: Positive for left sided abdominal discomfort. Negative for constipation, diarrhea, nausea and vomiting.  Genitourinary: Negative for bladder incontinence, difficulty urinating, dysuria, frequency and hematuria.   Musculoskeletal: Negative for back pain, gait problem, neck pain and neck stiffness.  Skin: Negative for itching and rash.  Neurological: Negative for dizziness, extremity weakness, gait problem, headaches, light-headedness and seizures.  Hematological: Negative for adenopathy. Does not bruise/bleed easily.  Psychiatric/Behavioral: Positive for depression? Negative for confusion.    PHYSICAL EXAMINATION:  Blood pressure (!) 136/54, pulse 85, temperature 98.7 F (37.1 C), temperature source Oral, resp. rate 18, height 4\' 11"  (1.499 m), weight 134 lb 4.8 oz (60.9 kg), SpO2 100 %.  ECOG PERFORMANCE STATUS: 1 - Symptomatic but completely ambulatory  Physical Exam  Constitutional: Oriented to person, place, and time and well-developed, well-nourished, and in no distress.  HENT:  Head: Normocephalic and atraumatic.  Mouth/Throat: Oropharynx is clear and moist. No oropharyngeal exudate.  Eyes: Conjunctivae are normal. Right eye exhibits  no discharge. Left eye exhibits no discharge. No scleral icterus.  Neck: Normal range of motion. Neck supple.  Cardiovascular: Normal rate, regular rhythm, normal heart sounds and intact distal pulses.   Pulmonary/Chest: Effort normal and breath sounds normal. No respiratory distress. No wheezes. No rales.  Abdominal: Tenderness to palpation over left upper/lower quadrant. Soft. Bowel sounds are normal. Exhibits no distension and no mass.  Musculoskeletal: Normal range of motion. Exhibits no edema.  Lymphadenopathy:    No cervical adenopathy.  Neurological: Alert and oriented to person, place, and time. Exhibits normal muscle tone. Gait normal. Coordination normal.  Skin: Skin is warm and dry. No rash noted. Not diaphoretic. No erythema. No pallor.  Psychiatric: Mood, memory and judgment normal.  Vitals reviewed.  LABORATORY DATA: Lab Results  Component Value Date   WBC 12.8 (H) 06/01/2019   HGB 9.9 (L) 06/01/2019   HCT 31.5 (L) 06/01/2019   MCV 89.2 06/01/2019   PLT 275 06/01/2019      Chemistry      Component Value Date/Time   NA 139 06/01/2019 0748   NA 141 01/08/2018   K 5.0 06/01/2019 0748   CL 105 06/01/2019 0748   CO2 21 (L) 06/01/2019 0748   BUN 24 (H) 06/01/2019 0748   CREATININE 1.06 (H) 06/01/2019 1610  CREATININE 1.14 (H) 05/25/2019 1353   GLU 102 01/08/2018      Component Value Date/Time   CALCIUM 9.7 06/01/2019 0748   ALKPHOS 227 (H) 06/01/2019 0748   AST 22 06/01/2019 0748   AST 15 05/25/2019 1353   ALT 17 06/01/2019 0748   ALT 13 05/25/2019 1353   BILITOT 0.5 06/01/2019 0748   BILITOT 0.2 (L) 05/25/2019 1353       RADIOGRAPHIC STUDIES:  Mr Jeri Cos VV Contrast  Result Date: 06/11/2019 CLINICAL DATA:  Non-small cell lung cancer staging. EXAM: MRI HEAD WITHOUT AND WITH CONTRAST TECHNIQUE: Multiplanar, multiecho pulse sequences of the brain and surrounding structures were obtained without and with intravenous contrast. CONTRAST:  6 mL Gadavist  COMPARISON:  None. FINDINGS: Brain: There is no evidence of acute infarct, intracranial hemorrhage, mass, midline shift, or extra-axial fluid collection. Mild cerebral atrophy is not greater than expected for age. Small foci of T2 hyperintensity in the cerebral white matter bilaterally are nonspecific but compatible with mild-to-moderate chronic small vessel ischemic disease. No abnormal enhancement is identified. Vascular: Major intracranial vascular flow voids are preserved. Skull and upper cervical spine: Unremarkable bone marrow signal. Sinuses/Orbits: Unremarkable orbits. Clear paranasal sinuses. Trace left mastoid effusion. Other: None. IMPRESSION: 1. No evidence of intracranial metastases. 2. Mild-to-moderate chronic small vessel ischemic disease. Electronically Signed   By: Logan Bores M.D.   On: 06/11/2019 15:19   Nm Pet Image Initial (pi) Skull Base To Thigh  Result Date: 06/02/2019 CLINICAL DATA:  Initial treatment strategy for non-small cell lung cancer. EXAM: NUCLEAR MEDICINE PET SKULL BASE TO THIGH TECHNIQUE: 6.9 mCi F-18 FDG was injected intravenously. Full-ring PET imaging was performed from the skull base to thigh after the radiotracer. CT data was obtained and used for attenuation correction and anatomic localization. Fasting blood glucose: 95 mg/dl COMPARISON:  CT chest from 05/12/2019 FINDINGS: Mediastinal blood pool activity: SUV max 2.7 Liver activity: SUV max NA NECK: No significant abnormal hypermetabolic activity in this region. Incidental CT findings: Mild chronic left maxillary sinusitis. Bilateral common carotid atherosclerotic vascular disease. CHEST: Right upper lobe retro hilar and suprahilar mass approximately 4.1 by 3.2 cm on image 25/8 with some central necrosis and central cavitation, maximum SUV 19.8. Right hilar lymph node approximately 1.3 cm in diameter, maximum SUV 12.6, compatible with malignancy. Small bilateral pulmonary nodules are present. Flow most of these are too  small to characterize and not perceptibly hypermetabolic, a 0.5 cm left apically pulmonary nodule on image 12/8 has maximum SUV of 1.7. Given the small size of the nodule the appearance is most compatible with malignancy in this setting. Incidental CT findings: Centrilobular emphysema. Old granulomatous disease. Coronary, aortic arch, and branch vessel atherosclerotic vascular disease. ABDOMEN/PELVIS: Approximately 11.3 by 5.9 by 6.6 cm left adrenal mass is highly hypermetabolic. Maximum SUV 15.0. This abuts the kidney, and the pancreatic tail. Just above the celiac trunk, a 0.6 cm in short axis lymph node has maximum SUV of 5.4 compatible with malignant involvement. Accentuated activity in the antropyloric region, likely physiologic. Incidental CT findings: Cholecystectomy. Aortoiliac atherosclerotic vascular disease. Mild descending and sigmoid colon diverticulosis. SKELETON: No significant abnormal hypermetabolic activity in this region. Incidental CT findings: none IMPRESSION: 1. Right upper lobe retro hilar and suprahilar mass is highly hypermetabolic with maximum SUV 19.8. 2. Hypermetabolic enlarged right hilar lymph node, maximum SUV 12.6. 3. Most of the small pulmonary nodules in both lungs are too small to characterize. However, a left upper lobe 5 mm nodule demonstrates hypermetabolic  activity relative to its size, and accordingly the small nodules are suspicious for metastatic lesions. 4. Large left adrenal mass is highly hypermetabolic, maximum SUV 81.1, compatible with malignancy. 5. A small lymph node just above the celiac trunk is hypermetabolic, compatible with malignancy. 6. Other imaging findings of potential clinical significance: Aortic Atherosclerosis (ICD10-I70.0) and Emphysema (ICD10-J43.9). Coronary atherosclerosis. Descending and sigmoid colon diverticulosis. Mild chronic left maxillary sinusitis. Electronically Signed   By: Van Clines M.D.   On: 06/02/2019 14:51   Ct  Biopsy  Result Date: 06/01/2019 INDICATION: 73 year old female with left adrenal mass. She presents for biopsy to facilitate tissue diagnosis. EXAM: CT BIOPSY MEDICATIONS: None. ANESTHESIA/SEDATION: Moderate (conscious) sedation was employed during this procedure. A total of Versed 2 mg and Fentanyl 100 mcg was administered intravenously. Moderate Sedation Time: 12 minutes. The patient's level of consciousness and vital signs were monitored continuously by radiology nursing throughout the procedure under my direct supervision. FLUOROSCOPY TIME:  None. COMPLICATIONS: None immediate. PROCEDURE: Informed written consent was obtained from the patient after a thorough discussion of the procedural risks, benefits and alternatives. All questions were addressed. A timeout was performed prior to the initiation of the procedure. A planning axial CT scan was performed. The left adrenal mass was successfully localized. The patient was placed in the left lateral decubitus position to facilitate hypoinflation of the left lung and allowed for a safe window without transgressing the pleura. Repeat imaging was performed. A suitable skin entry site was selected and marked. The overlying skin was sterilely prepped and draped in the standard fashion using chlorhexidine skin prep. Local anesthesia was attained by infiltration with 1% lidocaine. A small dermatotomy was made. Under intermittent CT guidance, a 17 gauge introducer needle was advanced and positioned in the margin of the mass. Multiple 18 gauge core biopsies were then coaxially obtained using the bio Pince automated biopsy device. Biopsy specimens were placed in formalin and delivered to pathology for further analysis. As the introducer needle was removed, the biopsy tract was embolized with a Gel-Foam slurry. Post biopsy axial CT imaging demonstrates no evidence of immediate complication. The patient remained normotensive throughout the procedure. IMPRESSION: Technically  successful CT-guided core biopsy of left adrenal mass. Electronically Signed   By: Jacqulynn Cadet M.D.   On: 06/01/2019 10:57     ASSESSMENT/PLAN:  This is a very pleasant 73 year old Caucasian female recently diagnosed with stage IV (T2b, 1, and 1C) non-small cell lung cancer, adenocarcinoma.  She presented with a large right upper lobe lung mass in addition to right hilar lymphadenopathy as well as a large metastatic lesion to the left adrenal gland as well as abdominal lymphadenopathy.  She was diagnosed in July 2020.  PDL 1 expression is pending.   The patient recently had a brain MRI performed as well as molecular studies by [facility].  She is here today to discuss her current condition and treatment options.  Dr. Julien Nordmann personally and independently reviewed the results and discussed the results with the patient today.  Her brain MRI was negative for any evidence of metastatic disease.  Her molecular studies showed no actionable mutations.  Dr. Julien Nordmann gave the patient the option of systemic chemotherapy with carboplatin AUC 5, Alimta 500 mg/m, and Keytruda 200 mg IV every 3 weeks versus a referral to palliative/hospice.  The patient is interested in pursuing hospice/palliative care at this time.   The patient's biopsy has been sent for foundation one testing. The results are still pending. If any additional treatment  options are available pending the results, we will reach out to the patient and communicate these options to her.   We will arrange for a referral to [facility] palliative care/hospice at this time to management of her symptoms. We will not schedule any further appointments at this time and will see the patient on an as needed basis.   The patient's recent PET scan was reviewed. The patient's left abdominal pain is likely secondary to her large adrenal mass.   The patient was encouraged to follow with her primary provider regarding managment her other health concerns  such as her restless legs.  The patient was advised to call immediately if she has any concerning symptoms. The patient voices understanding of current disease status and treatment options and is in agreement with the current care plan. All questions were answered. The patient knows to call the clinic with any problems, questions or concerns.   Orders Placed This Encounter  Procedures  . Ambulatory referral to Hospice    Referral Priority:   Routine    Referral Type:   Consultation    Referral Reason:   Specialty Services Required    Requested Specialty:   Hospice Services    Number of Visits Requested:   [place], PA-C 06/17/19  ADDENDUM: Hematology/Oncology Attending: I had a face-to-face encounter with the patient today.  I recommended his care plan.  This is a very pleasant 73 years old white female recently diagnosed with metastatic non-small cell lung cancer, adenocarcinoma presented with large right upper lobe lung mass in addition to right hilar lymphadenopathy as well as large metastatic left adrenal gland lesion and abdominal lymphadenopathy diagnosed in July 2020. The patient had imaging studies performed recently including a PET scan as well as MRI of the brain.  I personally and independently reviewed the scan images and discussed the results with the patient today. Her MRI of the brain showed no evidence for metastatic disease to the brain. She also had molecular studies by [facility] that showed no actionable mutation.  Her tissue block was also sent to [facility] for molecular studies and PDL 1 expression. I had a lengthy discussion with the patient today about her current treatment options.  She understands that she has incurable condition and all the treatment will be of palliative nature. I gave the patient the option of palliative care and hospice referral versus consideration of palliative systemic chemotherapy with carboplatin for AUC of 5,  Alimta 500 mg/M2 and Keytruda 200 mg IV every 3 weeks.  The patient is not interested in any systemic therapy and she preferred to proceed with hospice. We will refer her to the hospice service of [facility]. She was also advised to follow-up with her primary care physician for any other cancer unrelated issues. We will see her on as-needed basis at this point. She was advised to call if she has any concerning symptoms.  Disclaimer: This note was dictated with voice recognition software. Similar sounding words can inadvertently be transcribed and may be missed upon review. Eilleen Kempf, MD 06/17/19

## 2019-06-18 ENCOUNTER — Telehealth: Payer: Self-pay

## 2019-06-18 ENCOUNTER — Telehealth: Payer: Self-pay | Admitting: *Deleted

## 2019-06-18 NOTE — Telephone Encounter (Signed)
Received phone call from [facility] admissions RN. Patient was evaluated for hospice services today. Although, patient would meet criteria for hospice, she is requesting to begin with Palliative Care. Verbal ok obtained from Dr. Julien Nordmann.

## 2019-06-18 NOTE — Telephone Encounter (Signed)
Received call from Admissions nurse with [facility]. Butch Penny states she met with pt today.  Butch Penny states that patient is wanting to start with Palliative care at this time. Butch Penny states pt is completely independent at this time and desires to start with getting her affairs in order A social worker with Palliative Care will be meeting with her.  Maxwell Caul, RN with Palliative Care will also be in contact with patient. Butch Penny assures [place] that when pt needs to transition to full Hospice care, the Palliative care team will facilitate this. Dr. Julien Nordmann made aware of this

## 2019-06-18 NOTE — Telephone Encounter (Signed)
Visit scheduled with Palliative NP for Monday 06/22/2019. Patient requested to speak with SW as well. Messaged Palliative Care SW to make her aware.

## 2019-06-19 ENCOUNTER — Encounter: Payer: Self-pay | Admitting: Family Medicine

## 2019-06-19 ENCOUNTER — Ambulatory Visit (INDEPENDENT_AMBULATORY_CARE_PROVIDER_SITE_OTHER): Payer: Medicare Other | Admitting: Family Medicine

## 2019-06-19 ENCOUNTER — Other Ambulatory Visit: Payer: Medicare Other | Admitting: Licensed Clinical Social Worker

## 2019-06-19 ENCOUNTER — Other Ambulatory Visit: Payer: Self-pay

## 2019-06-19 ENCOUNTER — Encounter (HOSPITAL_COMMUNITY): Payer: Self-pay | Admitting: Internal Medicine

## 2019-06-19 VITALS — BP 118/54 | HR 87 | Temp 98.8°F | Resp 14 | Ht 59.0 in | Wt 133.6 lb

## 2019-06-19 DIAGNOSIS — G2581 Restless legs syndrome: Secondary | ICD-10-CM | POA: Diagnosis not present

## 2019-06-19 DIAGNOSIS — Z515 Encounter for palliative care: Secondary | ICD-10-CM

## 2019-06-19 MED ORDER — ROPINIROLE HCL 0.5 MG PO TABS: ORAL_TABLET | ORAL | 2 refills | Status: AC

## 2019-06-19 NOTE — Progress Notes (Signed)
Subjective  CC:  Chief Complaint  Patient presents with  . Restless Legs    Started about 1 yr ago has been on and off.. Having trouble getting sleep.. She has Xanax that she takes every other day with minimal relief    HPI: Latoya Mcfarland is a 73 y.o. female who presents to the office today to address the problems listed above in the chief complaint.  Very pleasant 73 yo with stage 4 lung cancer has worsening RLS sxs: classic restless legs impairing sleep. She is anemic due to cancer but is terminal.   No swelling or rash.   Assessment  1. Restless leg syndrome      Plan   RLS:  Educated and discussed treatment options; given her lung cancer, will treat to effectiveness with ropinirole; start 0.5 and titrate up. May need to get to 3-4mg  nightly. No other labs or work up indicated at this time. Patient understands and agrees with care plan.    Follow up: 3-4 weeks to adjust meds if needed  07/10/2019  No orders of the defined types were placed in this encounter.  Meds ordered this encounter  Medications  . rOPINIRole (REQUIP) 0.5 MG tablet    Sig: Take 1 tablet (0.5 mg total) by mouth at bedtime for 7 days, THEN 2 tablets (1 mg total) at bedtime.    Dispense:  60 tablet    Refill:  2      I reviewed the patients updated PMH, FH, and SocHx.    Patient Active Problem List   Diagnosis Date Noted  . Adenocarcinoma of right lung, stage 4 ([place]) 06/04/2019  . Encounter for antineoplastic chemotherapy 06/04/2019  . Goals of care, counseling/discussion 06/04/2019  . Cavitating mass in right lower lung lobe 05/25/2019  . Mass of left adrenal gland ([place]) 05/25/2019  . 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 Elevated alkaline phosphatase level 10/15/2018  . Chronic anemia 10/03/2018  . History of leukocytosis 10/03/2018  . Vitamin D deficiency 10/03/2018  . B12 deficiency 10/03/2018  . Osteopenia 10/03/2018  . Benign essential HTN 09/03/2018  . GERD (gastroesophageal  reflux disease) 09/03/2018  . Hyperlipidemia 09/03/2018  . Heart murmur 09/03/2018   Current Meds  Medication Sig  . ALPRAZolam (XANAX) 0.25 MG tablet Take 1 tablet (0.25 mg total) by mouth 2 (two) times daily as needed for anxiety.  Marland Kitchen aspirin EC 81 MG tablet Take 81 mg by mouth daily.  . Cholecalciferol (VITAMIN D3 PO) Take by mouth daily.  Marland Kitchen lisinopril (ZESTRIL) 30 MG tablet Take 1 tablet (30 mg total) by mouth daily.  . Magnesium 400 MG TABS Take by mouth daily.  . Multiple Vitamin (MULTIVITAMIN) tablet Take 1 tablet by mouth daily.  . Multiple Vitamins-Minerals (PRESERVISION AREDS 2 PO) Take by mouth 2 (two) times a day.  Marland Kitchen omeprazole (PRILOSEC) 40 MG capsule   . rosuvastatin (CRESTOR) 20 MG tablet Take 20 mg by mouth daily.    Allergies: Patient is allergic to atorvastatin and lescol [fluvastatin]. Family History: Patient family history includes COPD in her mother; Heart attack in her father; Stroke in her brother. Social History:  Patient  reports that she quit smoking about 2 months ago. Her smoking use included cigarettes. She has a 50.00 pack-year smoking history. She has never used smokeless tobacco. She reports previous alcohol use. She reports that she does not use drugs.  Review of Systems: Constitutional: Negative for fever malaise or anorexia Cardiovascular: negative for chest pain Respiratory: negative for  SOB or persistent cough Gastrointestinal: negative for abdominal pain  Objective  Vitals: BP (!) 118/54   Pulse 87   Temp 98.8 F (37.1 C) (Tympanic)   Resp 14   Ht 4\' 11"  (1.499 m)   Wt 133 lb 9.6 oz (60.6 kg)   LMP  (LMP Unknown)   SpO2 98%   BMI 26.98 kg/m  General: no acute distress , A&Ox3      Commons side effects, risks, benefits, and alternatives for medications and treatment plan prescribed today were discussed, and the patient expressed understanding of the given instructions. Patient is instructed to call or message via MyChart if he/she  has any questions or concerns regarding our treatment plan. No barriers to understanding were identified. We discussed Red Flag symptoms and signs in detail. Patient expressed understanding regarding what to do in case of urgent or emergency type symptoms.   Medication list was reconciled, printed and provided to the patient in AVS. Patient instructions and summary information was reviewed with the patient as documented in the AVS. This note was prepared with assistance of Dragon voice recognition software. Occasional wrong-word or sound-a-like substitutions may have occurred due to the inherent limitations of voice recognition software

## 2019-06-19 NOTE — Patient Instructions (Signed)
Please return in 3-4 weeks with Dr. Rogers Blocker to evaluate the new medication for RLS   If you have any questions or concerns, please don't hesitate to send me a message via MyChart or call the office at [phone]. Thank you for visiting with [place] today! It's our pleasure caring for you.    Restless Legs Syndrome Restless legs syndrome is a condition that causes uncomfortable feelings or sensations in the legs, especially while sitting or lying down. The sensations usually cause an overwhelming urge to move the legs. The arms can also sometimes be affected. The condition can range from mild to severe. The symptoms often interfere with a person's ability to sleep. What are the causes? The cause of this condition is not known. What increases the risk? The following factors may make you more likely to develop this condition:  Being older than 50.  Pregnancy.  Being a woman. In general, the condition is more common in women than in men.  A family history of the condition.  Having iron deficiency.  Overuse of caffeine, nicotine, or alcohol.  Certain medical conditions, such as kidney disease, Parkinson's disease, or nerve damage.  Certain medicines, such as those for high blood pressure, nausea, colds, allergies, depression, and some heart conditions. What are the signs or symptoms? The main symptom of this condition is uncomfortable sensations in the legs, such as:  Pulling.  Tingling.  Prickling.  Throbbing.  Crawling.  Burning. Usually, the sensations:  Affect both sides of the body.  Are worse when you sit or lie down.  Are worse at night. These may wake you up or make it difficult to fall asleep.  Make you have a strong urge to move your legs.  Are temporarily relieved by moving your legs. The arms can also be affected, but this is rare. People who have this condition often have tiredness during the day because of their lack of sleep at night. How is this diagnosed?  This condition may be diagnosed based on:  Your symptoms.  Blood tests. In some cases, you may be monitored in a sleep lab by a specialist (a sleep study). This can detect any disruptions in your sleep. How is this treated? This condition is treated by managing the symptoms. This may include:  Lifestyle changes, such as exercising, using relaxation techniques, and avoiding caffeine, alcohol, or tobacco.  Medicines. Anti-seizure medicines may be tried first. Follow these instructions at home:     General instructions  Take over-the-counter and prescription medicines only as told by your health care provider.  Use methods to help relieve the uncomfortable sensations, such as: ? Massaging your legs. ? Walking or stretching. ? Taking a cold or hot bath.  Keep all follow-up visits as told by your health care provider. This is important. Lifestyle  Practice good sleep habits. For example, go to bed and get up at the same time every day. Most adults should get 7-9 hours of sleep each night.  Exercise regularly. Try to get at least 30 minutes of exercise most days of the week.  Practice ways of relaxing, such as yoga or meditation.  Avoid caffeine and alcohol.  Do not use any products that contain nicotine or tobacco, such as cigarettes and e-cigarettes. If you need help quitting, ask your health care provider. Contact a health care provider if:  Your symptoms get worse or they do not improve with treatment. Summary  Restless legs syndrome is a condition that causes uncomfortable feelings or sensations in  the legs, especially while sitting or lying down.  The symptoms often interfere with a person's ability to sleep.  This condition is treated by managing the symptoms. You may need to make lifestyle changes or take medicines. This information is not intended to replace advice given to you by your health care provider. Make sure you discuss any questions you have with your  health care provider. Document Released: 10/19/2002 Document Revised: 11/18/2017 Document Reviewed: 11/18/2017 Elsevier Patient Education  2020 [employer].

## 2019-06-22 ENCOUNTER — Other Ambulatory Visit: Payer: Self-pay

## 2019-06-22 ENCOUNTER — Encounter: Payer: Self-pay | Admitting: Internal Medicine

## 2019-06-22 ENCOUNTER — Other Ambulatory Visit: Payer: Medicare Other | Admitting: Internal Medicine

## 2019-06-22 DIAGNOSIS — Z515 Encounter for palliative care: Secondary | ICD-10-CM | POA: Diagnosis not present

## 2019-06-22 DIAGNOSIS — C3491 Malignant neoplasm of unspecified part of right bronchus or lung: Secondary | ICD-10-CM | POA: Diagnosis not present

## 2019-06-22 NOTE — Progress Notes (Signed)
Jun 22, 2019 [facility] Palliative Care Consult Note Telephone: [phone]  Fax: [phone]  PATIENT NAME: Latoya Mcfarland DOB: Jun 18, 1946 MRN: [id number] Building [address] Code [phone] Phone: [phone] PRIMARY CARE PROVIDER:  Orma Flaming, MD [address] [place],  [place] [address] . Dr. Billey Chang ([place] 06/19/2019) Dr. Curt Bears ([place])  REFERRING PROVIDER: Dr. Curt Bears  RESPONSIBLE PARTY: (son) Dr. Aletta Edouard [facility] ER physician).  ASSESSMENT / RECOMMENDATIONS:  1. Advance Care Planning: Patient shared that Dr. Julien Nordmann predicted a prognosis of 3-6 months. a. Directives: Living will from (02/19/2019) uploaded into [place]. Reviewed, completed, and uploaded into [facility] the DNR and MOST forms. MOST details: DNR/DNI, Comfort level of care. Antibiotics to be determined at the time. No IVFs or tube feedings. Original documents left with patient in her home. Suggested she post one copy of DNR onto fridgeand carry other with her should she leave the home.  b. Goals of Care: "getting my affairs in order" so that she is not a burden to her son and his family in the future. We discussed cremation services. [place] LCSW Palliative care was consulted.   2.Cognitive / Functional assessment:  A & O x 3. Shows good insight and judgement regarding illness and prognosis. She can perform all ADLs including driving and grocery shopping without difficulty. On 06/19/2019 her weight was 133 lbs. At a height of 4'11" her BMI is 27 kg/m2.  3. Symptom Management:   -Left abdominal discomfort likely r/t large adrenal mass.   - Recently seen by Dr Jonni Sanger for restless legs. Started requip 0.5mg  qd for 1 week, then to  increase to 2 tabs at hs  -Notices more fatigued this week as compared to last.  4. Family Supports: Her second husband (from [place]) of 45 years  recently deceased last year d/t cancer. Patient moved to [facility] to be close to her son, daughter-in-law and 3 grandchildren. She lives in her own apt on ground level and has developed some friendships within the complex (walking buddies) who know her diagnosis. One friend called to check in on her, during my visit.   5. Follow up Palliative Care Visit: referral to hospice. She had recent evaluation for hospice about 1 week earlier and thought she was not quite ready for services. However, has become increasingly fatigued over this last week and now feels comfortable with referral. She has remained steadfast in her decision not to pursue any aggressive or palliative chemo, immune, or radiation therapy.   I spent 60 minutes providing this consultation from 3:30pm to 4:30pm. More than 50% of the time in this consultation was spent coordinating communication.   HISTORY OF PRESENT ILLNESS:  Latoya Mcfarland is a 73 year old Caucasian female recently diagnosed (July 2020) with stage IV (T2b, 1, and 1C) non-small cell lung cancer, adenocarcinoma.  She presented with a large right upper lobe lung mass, right hilar lymphadenopathy, large metastatic lesion to the left adrenal gland, and abdominal lymphadenopathy.  Brain MRI neg for metastatic disease. Palliative Care was asked to help address goals of care.   CODE STATUS: DNR. MOST: DNR/DNI. Scope of Medical Care: Comfort. Determine use of antibiotics at time of need. No to IVFs and tube feedings.  PPS: 80% HOSPICE ELIGIBILITY/DIAGNOSIS: yes/metastatic lung cancer  PAST MEDICAL HISTORY:  Past Medical History:  Diagnosis Date  . Anemia   . Anxiety   . Benign neoplasm of kidney  05/06/2019  . Depression   . Diverticulitis   . GERD (gastroesophageal reflux disease)   . Heart murmur   . History of chicken pox   . Hyperlipidemia   . Hypertension     SOCIAL HX:  Social History   Tobacco Use  . Smoking status: Former Smoker    Packs/day: 1.00     Years: 50.00    Pack years: 50.00    Types: Cigarettes    Quit date: 04/19/2019    Years since quitting: 0.1  . Smokeless tobacco: Never Used  Substance Use Topics  . Alcohol use: Not Currently    ALLERGIES:  Allergies  Allergen Reactions  . Atorvastatin Hives  . Lescol [Fluvastatin] Hives     PERTINENT MEDICATIONS:  Outpatient Encounter Medications as of 06/22/2019  Medication Sig  . ALPRAZolam (XANAX) 0.25 MG tablet Take 1 tablet (0.25 mg total) by mouth 2 (two) times daily as needed for anxiety.  Marland Kitchen aspirin EC 81 MG tablet Take 81 mg by mouth daily.  . Cholecalciferol (VITAMIN D3 PO) Take by mouth daily.  Marland Kitchen lisinopril (ZESTRIL) 30 MG tablet Take 1 tablet (30 mg total) by mouth daily.  . Magnesium 400 MG TABS Take by mouth daily.  . Multiple Vitamin (MULTIVITAMIN) tablet Take 1 tablet by mouth daily.  . Multiple Vitamins-Minerals (PRESERVISION AREDS 2 PO) Take by mouth 2 (two) times a day.  Marland Kitchen omeprazole (PRILOSEC) 40 MG capsule   . rOPINIRole (REQUIP) 0.5 MG tablet Take 1 tablet (0.5 mg total) by mouth at bedtime for 7 days, THEN 2 tablets (1 mg total) at bedtime.  . rosuvastatin (CRESTOR) 20 MG tablet Take 20 mg by mouth daily.   No facility-administered encounter medications on file as of 06/22/2019.     PHYSICAL EXAM:   General: NAD, pleasantly conversant Cardiovascular: regular rate and rhythm Pulmonary: clear ant fields Abdomen: soft, nontender, + bowel sounds Extremities: no edema, no joint deformities Skin: no rashes Neurological: Nonfocal  Latoya Handler, NP

## 2019-06-22 NOTE — Progress Notes (Signed)
COMMUNITY PALLIATIVE CARE SW NOTE  PATIENT NAME: Latoya Mcfarland DOB: 12/23/1945 MRN: [id number]  PRIMARY CARE PROVIDER: Orma Flaming, MD  RESPONSIBLE PARTY:  Acct ID - Guarantor Home Phone Work Phone Relationship Acct Type  [id number] [phone]  Self P/F     [address] [address], [place], [place] [address]   Due to the COVID-19 crisis, this virtual check-in visit was done via telephone from my office and it was initiated and consent given by this patientand orfamily.   PLAN OF CARE and INTERVENTIONS:             1. GOALS OF CARE/ ADVANCE CARE PLANNING:  Patient's goal is to have all of her affairs in order and to not have pain.  She has a DNR. 2. SOCIAL/EMOTIONAL/SPIRITUAL ASSESSMENT/ INTERVENTIONS:  SW contacted patient to assess comfort and needs.  Patient recently moved to [facility] to be closer to her son, who is a MD.  She requested information on cremation services and planning for the end of life.  SW also encouraged her to contact an elder care lawyer.  Discussed what to expect as her condition declines. 3. PATIENT/CAREGIVER EDUCATION/ COPING:  Patient copes by expressing her feelings openly. 4. PERSONAL EMERGENCY PLAN:  She will contact her son. 5. COMMUNITY RESOURCES COORDINATION/ HEALTH CARE NAVIGATION:  None. 6. FINANCIAL/LEGAL CONCERNS/INTERVENTIONS:  None.     SOCIAL HX:  Social History   Tobacco Use  . Smoking status: Former Smoker    Packs/day: 1.00    Years: 50.00    Pack years: 50.00    Types: Cigarettes    Quit date: 04/19/2019    Years since quitting: 0.1  . Smokeless tobacco: Never Used  Substance Use Topics  . Alcohol use: Not Currently    CODE STATUS:  DNR  ADVANCED DIRECTIVES:  LW, POA MOST FORM COMPLETE:  N HOSPICE EDUCATION PROVIDED:  Y PPS:  Patient reports her appetite is normal.  She ambulates independently. Duration of visit and documentation:  45 minutes.      [place] Bruce Mayers, LCSW

## 2019-06-25 ENCOUNTER — Encounter (HOSPITAL_COMMUNITY): Payer: Self-pay | Admitting: Internal Medicine

## 2019-06-25 DIAGNOSIS — C3491 Malignant neoplasm of unspecified part of right bronchus or lung: Secondary | ICD-10-CM | POA: Diagnosis not present

## 2019-06-25 DIAGNOSIS — C7972 Secondary malignant neoplasm of left adrenal gland: Secondary | ICD-10-CM | POA: Diagnosis not present

## 2019-06-25 DIAGNOSIS — E785 Hyperlipidemia, unspecified: Secondary | ICD-10-CM | POA: Diagnosis not present

## 2019-06-25 DIAGNOSIS — F419 Anxiety disorder, unspecified: Secondary | ICD-10-CM | POA: Diagnosis not present

## 2019-06-25 DIAGNOSIS — K219 Gastro-esophageal reflux disease without esophagitis: Secondary | ICD-10-CM | POA: Diagnosis not present

## 2019-06-25 DIAGNOSIS — D630 Anemia in neoplastic disease: Secondary | ICD-10-CM | POA: Diagnosis not present

## 2019-06-25 DIAGNOSIS — I2510 Atherosclerotic heart disease of native coronary artery without angina pectoris: Secondary | ICD-10-CM | POA: Diagnosis not present

## 2019-06-25 DIAGNOSIS — C7802 Secondary malignant neoplasm of left lung: Secondary | ICD-10-CM | POA: Diagnosis not present

## 2019-06-25 DIAGNOSIS — R0600 Dyspnea, unspecified: Secondary | ICD-10-CM | POA: Diagnosis not present

## 2019-06-25 DIAGNOSIS — G2581 Restless legs syndrome: Secondary | ICD-10-CM | POA: Diagnosis not present

## 2019-06-25 DIAGNOSIS — F329 Major depressive disorder, single episode, unspecified: Secondary | ICD-10-CM | POA: Diagnosis not present

## 2019-06-25 DIAGNOSIS — I10 Essential (primary) hypertension: Secondary | ICD-10-CM | POA: Diagnosis not present

## 2019-06-26 DIAGNOSIS — C3491 Malignant neoplasm of unspecified part of right bronchus or lung: Secondary | ICD-10-CM | POA: Diagnosis not present

## 2019-06-26 DIAGNOSIS — D630 Anemia in neoplastic disease: Secondary | ICD-10-CM | POA: Diagnosis not present

## 2019-06-26 DIAGNOSIS — I2510 Atherosclerotic heart disease of native coronary artery without angina pectoris: Secondary | ICD-10-CM | POA: Diagnosis not present

## 2019-06-26 DIAGNOSIS — C7972 Secondary malignant neoplasm of left adrenal gland: Secondary | ICD-10-CM | POA: Diagnosis not present

## 2019-06-26 DIAGNOSIS — R0600 Dyspnea, unspecified: Secondary | ICD-10-CM | POA: Diagnosis not present

## 2019-06-26 DIAGNOSIS — C7802 Secondary malignant neoplasm of left lung: Secondary | ICD-10-CM | POA: Diagnosis not present

## 2019-06-29 DIAGNOSIS — I2510 Atherosclerotic heart disease of native coronary artery without angina pectoris: Secondary | ICD-10-CM | POA: Diagnosis not present

## 2019-06-29 DIAGNOSIS — R0600 Dyspnea, unspecified: Secondary | ICD-10-CM | POA: Diagnosis not present

## 2019-06-29 DIAGNOSIS — C3491 Malignant neoplasm of unspecified part of right bronchus or lung: Secondary | ICD-10-CM | POA: Diagnosis not present

## 2019-06-29 DIAGNOSIS — C7972 Secondary malignant neoplasm of left adrenal gland: Secondary | ICD-10-CM | POA: Diagnosis not present

## 2019-06-29 DIAGNOSIS — C7802 Secondary malignant neoplasm of left lung: Secondary | ICD-10-CM | POA: Diagnosis not present

## 2019-06-29 DIAGNOSIS — D630 Anemia in neoplastic disease: Secondary | ICD-10-CM | POA: Diagnosis not present

## 2019-06-30 DIAGNOSIS — R0600 Dyspnea, unspecified: Secondary | ICD-10-CM | POA: Diagnosis not present

## 2019-06-30 DIAGNOSIS — C7802 Secondary malignant neoplasm of left lung: Secondary | ICD-10-CM | POA: Diagnosis not present

## 2019-06-30 DIAGNOSIS — C7972 Secondary malignant neoplasm of left adrenal gland: Secondary | ICD-10-CM | POA: Diagnosis not present

## 2019-06-30 DIAGNOSIS — I2510 Atherosclerotic heart disease of native coronary artery without angina pectoris: Secondary | ICD-10-CM | POA: Diagnosis not present

## 2019-06-30 DIAGNOSIS — C3491 Malignant neoplasm of unspecified part of right bronchus or lung: Secondary | ICD-10-CM | POA: Diagnosis not present

## 2019-06-30 DIAGNOSIS — D630 Anemia in neoplastic disease: Secondary | ICD-10-CM | POA: Diagnosis not present

## 2019-07-09 DIAGNOSIS — C7802 Secondary malignant neoplasm of left lung: Secondary | ICD-10-CM | POA: Diagnosis not present

## 2019-07-09 DIAGNOSIS — I2510 Atherosclerotic heart disease of native coronary artery without angina pectoris: Secondary | ICD-10-CM | POA: Diagnosis not present

## 2019-07-09 DIAGNOSIS — R0600 Dyspnea, unspecified: Secondary | ICD-10-CM | POA: Diagnosis not present

## 2019-07-09 DIAGNOSIS — C3491 Malignant neoplasm of unspecified part of right bronchus or lung: Secondary | ICD-10-CM | POA: Diagnosis not present

## 2019-07-09 DIAGNOSIS — D630 Anemia in neoplastic disease: Secondary | ICD-10-CM | POA: Diagnosis not present

## 2019-07-09 DIAGNOSIS — C7972 Secondary malignant neoplasm of left adrenal gland: Secondary | ICD-10-CM | POA: Diagnosis not present

## 2019-07-10 ENCOUNTER — Encounter: Payer: Self-pay | Admitting: Family Medicine

## 2019-07-10 ENCOUNTER — Ambulatory Visit (INDEPENDENT_AMBULATORY_CARE_PROVIDER_SITE_OTHER): Admitting: Family Medicine

## 2019-07-10 ENCOUNTER — Other Ambulatory Visit: Payer: Self-pay

## 2019-07-10 VITALS — BP 146/70 | HR 78 | Temp 98.2°F | Ht 59.0 in | Wt 137.2 lb

## 2019-07-10 DIAGNOSIS — C7802 Secondary malignant neoplasm of left lung: Secondary | ICD-10-CM | POA: Diagnosis not present

## 2019-07-10 DIAGNOSIS — D630 Anemia in neoplastic disease: Secondary | ICD-10-CM | POA: Diagnosis not present

## 2019-07-10 DIAGNOSIS — I2510 Atherosclerotic heart disease of native coronary artery without angina pectoris: Secondary | ICD-10-CM | POA: Diagnosis not present

## 2019-07-10 DIAGNOSIS — C7972 Secondary malignant neoplasm of left adrenal gland: Secondary | ICD-10-CM | POA: Diagnosis not present

## 2019-07-10 DIAGNOSIS — C3491 Malignant neoplasm of unspecified part of right bronchus or lung: Secondary | ICD-10-CM | POA: Diagnosis not present

## 2019-07-10 DIAGNOSIS — R0600 Dyspnea, unspecified: Secondary | ICD-10-CM | POA: Diagnosis not present

## 2019-07-10 DIAGNOSIS — G2581 Restless legs syndrome: Secondary | ICD-10-CM

## 2019-07-10 NOTE — Progress Notes (Signed)
Patient: Latoya Mcfarland MRN: [id number] DOB: 1946-04-13 PCP: Orma Flaming, MD     Subjective:  Chief Complaint  Patient presents with  . RLS    3 WK FOLLOW UP    HPI: The patient is a 73 y.o. female who presents today for RLS. She was started on requip. She states it's not so much restless leg, it's her feet. She feels like her feet just want to move and she can't settle it down. She as put cream on them, a fan on them. The medication she was put on helps her, but causes nausea. She wants to take this as needed. She likes the 1mg  dosage and wants to give it more time even though it makes her nauseated.   Also wanting to review medication as she is on hospice for stage IV lung cancer.  Does not want to be on anything that is not essential.   Review of Systems  Constitutional: Positive for fatigue. Negative for chills and fever.  HENT: Negative for congestion, postnasal drip, rhinorrhea and sore throat.   Eyes: Negative for visual disturbance.  Respiratory: Positive for shortness of breath. Negative for cough and chest tightness.   Cardiovascular: Negative for chest pain, palpitations and leg swelling.  Gastrointestinal: Positive for diarrhea and nausea. Negative for abdominal pain and vomiting.  Musculoskeletal: Positive for back pain.  Skin: Negative for rash.  Neurological: Negative for dizziness and headaches.  Psychiatric/Behavioral: Positive for sleep disturbance.    Allergies Patient is allergic to atorvastatin and lescol [fluvastatin].  Past Medical History Patient  has a past medical history of Anemia, Anxiety, Benign neoplasm of kidney (05/06/2019), Depression, Diverticulitis, GERD (gastroesophageal reflux disease), Heart murmur, History of chicken pox, Hyperlipidemia, and Hypertension.  Surgical History Patient  has a past surgical history that includes Cholecystectomy (2000); Appendectomy (1961); Abdominal hysterectomy (1992); and Tonsillectomy and adenoidectomy  (1959).  Family History Pateint's family history includes COPD in her mother; Heart attack in her father; Stroke in her brother.  Social History Patient  reports that she quit smoking about 2 months ago. Her smoking use included cigarettes. She has a 50.00 pack-year smoking history. She has never used smokeless tobacco. She reports previous alcohol use. She reports that she does not use drugs.    Objective: Vitals:   07/10/19 1048  BP: (!) 146/70  Pulse: 78  Temp: 98.2 F (36.8 C)  TempSrc: Skin  SpO2: 98%  Weight: 137 lb 3.2 oz (62.2 kg)  Height: 4\' 11"  (1.499 m)    Body mass index is 27.71 kg/m.  Physical Exam Vitals signs reviewed.  Constitutional:      Appearance: Normal appearance.  HENT:     Head: Normocephalic and atraumatic.  Cardiovascular:     Rate and Rhythm: Normal rate and regular rhythm.     Heart sounds: Normal heart sounds.  Pulmonary:     Effort: Pulmonary effort is normal.     Breath sounds: Normal breath sounds.  Abdominal:     General: Abdomen is flat. Bowel sounds are normal.     Palpations: Abdomen is soft.  Neurological:     Mental Status: She is alert.  Psychiatric:        Mood and Affect: Mood normal.        Behavior: Behavior normal.        Assessment/plan: 1. RLS (restless legs syndrome) Discussed we could change her to mirapex, but she would like to continue to use this prn. Discussed she can email me if she wants  to change or if symptoms worsen as I do not want her nauseated from medication.  requip can cause nausea in 60% of patients. She will give it a little bit more time and if nausea continues will let me know and we can change her to mirapex. Also discussed gabapentin.   2. Adenocarcinoma of right lung, stage 4 (HCC) Stopping her ASA, magnesium, and crestor since she is on hospice. Does not want to be on medication unless it's essential. reviewed med list with her and updated changed in her chart.    Return if symptoms worsen  or fail to improve.   Orma Flaming, MD [place]   07/10/2019

## 2019-07-10 NOTE — Patient Instructions (Signed)
Let me know if you want to change medication and will do a change to mirapex instead of requip.   So good to see you. Let me know if you need anything!  Dr. Rogers Blocker

## 2019-07-14 DIAGNOSIS — F329 Major depressive disorder, single episode, unspecified: Secondary | ICD-10-CM | POA: Diagnosis not present

## 2019-07-14 DIAGNOSIS — I2510 Atherosclerotic heart disease of native coronary artery without angina pectoris: Secondary | ICD-10-CM | POA: Diagnosis not present

## 2019-07-14 DIAGNOSIS — E785 Hyperlipidemia, unspecified: Secondary | ICD-10-CM | POA: Diagnosis not present

## 2019-07-14 DIAGNOSIS — I10 Essential (primary) hypertension: Secondary | ICD-10-CM | POA: Diagnosis not present

## 2019-07-14 DIAGNOSIS — D630 Anemia in neoplastic disease: Secondary | ICD-10-CM | POA: Diagnosis not present

## 2019-07-14 DIAGNOSIS — C3491 Malignant neoplasm of unspecified part of right bronchus or lung: Secondary | ICD-10-CM | POA: Diagnosis not present

## 2019-07-14 DIAGNOSIS — C7972 Secondary malignant neoplasm of left adrenal gland: Secondary | ICD-10-CM | POA: Diagnosis not present

## 2019-07-14 DIAGNOSIS — R0600 Dyspnea, unspecified: Secondary | ICD-10-CM | POA: Diagnosis not present

## 2019-07-14 DIAGNOSIS — K219 Gastro-esophageal reflux disease without esophagitis: Secondary | ICD-10-CM | POA: Diagnosis not present

## 2019-07-14 DIAGNOSIS — G2581 Restless legs syndrome: Secondary | ICD-10-CM | POA: Diagnosis not present

## 2019-07-14 DIAGNOSIS — C7802 Secondary malignant neoplasm of left lung: Secondary | ICD-10-CM | POA: Diagnosis not present

## 2019-07-14 DIAGNOSIS — F419 Anxiety disorder, unspecified: Secondary | ICD-10-CM | POA: Diagnosis not present

## 2019-07-16 DIAGNOSIS — I2510 Atherosclerotic heart disease of native coronary artery without angina pectoris: Secondary | ICD-10-CM | POA: Diagnosis not present

## 2019-07-16 DIAGNOSIS — C3491 Malignant neoplasm of unspecified part of right bronchus or lung: Secondary | ICD-10-CM | POA: Diagnosis not present

## 2019-07-16 DIAGNOSIS — C7802 Secondary malignant neoplasm of left lung: Secondary | ICD-10-CM | POA: Diagnosis not present

## 2019-07-16 DIAGNOSIS — D630 Anemia in neoplastic disease: Secondary | ICD-10-CM | POA: Diagnosis not present

## 2019-07-16 DIAGNOSIS — R0600 Dyspnea, unspecified: Secondary | ICD-10-CM | POA: Diagnosis not present

## 2019-07-16 DIAGNOSIS — C7972 Secondary malignant neoplasm of left adrenal gland: Secondary | ICD-10-CM | POA: Diagnosis not present

## 2019-07-23 DIAGNOSIS — C3491 Malignant neoplasm of unspecified part of right bronchus or lung: Secondary | ICD-10-CM | POA: Diagnosis not present

## 2019-07-23 DIAGNOSIS — R0600 Dyspnea, unspecified: Secondary | ICD-10-CM | POA: Diagnosis not present

## 2019-07-23 DIAGNOSIS — D630 Anemia in neoplastic disease: Secondary | ICD-10-CM | POA: Diagnosis not present

## 2019-07-23 DIAGNOSIS — C7972 Secondary malignant neoplasm of left adrenal gland: Secondary | ICD-10-CM | POA: Diagnosis not present

## 2019-07-23 DIAGNOSIS — I2510 Atherosclerotic heart disease of native coronary artery without angina pectoris: Secondary | ICD-10-CM | POA: Diagnosis not present

## 2019-07-23 DIAGNOSIS — C7802 Secondary malignant neoplasm of left lung: Secondary | ICD-10-CM | POA: Diagnosis not present

## 2019-07-30 ENCOUNTER — Telehealth: Payer: Self-pay | Admitting: *Deleted

## 2019-07-30 ENCOUNTER — Other Ambulatory Visit: Payer: Self-pay | Admitting: *Deleted

## 2019-07-30 DIAGNOSIS — I2510 Atherosclerotic heart disease of native coronary artery without angina pectoris: Secondary | ICD-10-CM | POA: Diagnosis not present

## 2019-07-30 DIAGNOSIS — C3491 Malignant neoplasm of unspecified part of right bronchus or lung: Secondary | ICD-10-CM | POA: Diagnosis not present

## 2019-07-30 DIAGNOSIS — C7972 Secondary malignant neoplasm of left adrenal gland: Secondary | ICD-10-CM | POA: Diagnosis not present

## 2019-07-30 DIAGNOSIS — R0600 Dyspnea, unspecified: Secondary | ICD-10-CM | POA: Diagnosis not present

## 2019-07-30 DIAGNOSIS — D630 Anemia in neoplastic disease: Secondary | ICD-10-CM | POA: Diagnosis not present

## 2019-07-30 DIAGNOSIS — C7802 Secondary malignant neoplasm of left lung: Secondary | ICD-10-CM | POA: Diagnosis not present

## 2019-07-30 NOTE — Telephone Encounter (Signed)
Received vm message from pt's Hospice nurse that Hospice MD, Dr. Lyman Speller, has started pt on Hydrocodone/apap 5/325 1 tablet every 4 hours as needed for pain.

## 2019-08-06 DIAGNOSIS — C7972 Secondary malignant neoplasm of left adrenal gland: Secondary | ICD-10-CM | POA: Diagnosis not present

## 2019-08-06 DIAGNOSIS — C7802 Secondary malignant neoplasm of left lung: Secondary | ICD-10-CM | POA: Diagnosis not present

## 2019-08-06 DIAGNOSIS — I2510 Atherosclerotic heart disease of native coronary artery without angina pectoris: Secondary | ICD-10-CM | POA: Diagnosis not present

## 2019-08-06 DIAGNOSIS — D630 Anemia in neoplastic disease: Secondary | ICD-10-CM | POA: Diagnosis not present

## 2019-08-06 DIAGNOSIS — C3491 Malignant neoplasm of unspecified part of right bronchus or lung: Secondary | ICD-10-CM | POA: Diagnosis not present

## 2019-08-06 DIAGNOSIS — R0600 Dyspnea, unspecified: Secondary | ICD-10-CM | POA: Diagnosis not present

## 2019-08-13 DIAGNOSIS — I10 Essential (primary) hypertension: Secondary | ICD-10-CM | POA: Diagnosis not present

## 2019-08-13 DIAGNOSIS — F329 Major depressive disorder, single episode, unspecified: Secondary | ICD-10-CM | POA: Diagnosis not present

## 2019-08-13 DIAGNOSIS — K219 Gastro-esophageal reflux disease without esophagitis: Secondary | ICD-10-CM | POA: Diagnosis not present

## 2019-08-13 DIAGNOSIS — C3491 Malignant neoplasm of unspecified part of right bronchus or lung: Secondary | ICD-10-CM | POA: Diagnosis not present

## 2019-08-13 DIAGNOSIS — I2510 Atherosclerotic heart disease of native coronary artery without angina pectoris: Secondary | ICD-10-CM | POA: Diagnosis not present

## 2019-08-13 DIAGNOSIS — C7802 Secondary malignant neoplasm of left lung: Secondary | ICD-10-CM | POA: Diagnosis not present

## 2019-08-13 DIAGNOSIS — E785 Hyperlipidemia, unspecified: Secondary | ICD-10-CM | POA: Diagnosis not present

## 2019-08-13 DIAGNOSIS — C7972 Secondary malignant neoplasm of left adrenal gland: Secondary | ICD-10-CM | POA: Diagnosis not present

## 2019-08-13 DIAGNOSIS — R0600 Dyspnea, unspecified: Secondary | ICD-10-CM | POA: Diagnosis not present

## 2019-08-13 DIAGNOSIS — F419 Anxiety disorder, unspecified: Secondary | ICD-10-CM | POA: Diagnosis not present

## 2019-08-13 DIAGNOSIS — D630 Anemia in neoplastic disease: Secondary | ICD-10-CM | POA: Diagnosis not present

## 2019-08-13 DIAGNOSIS — G2581 Restless legs syndrome: Secondary | ICD-10-CM | POA: Diagnosis not present

## 2019-08-18 DIAGNOSIS — I2510 Atherosclerotic heart disease of native coronary artery without angina pectoris: Secondary | ICD-10-CM | POA: Diagnosis not present

## 2019-08-18 DIAGNOSIS — C3491 Malignant neoplasm of unspecified part of right bronchus or lung: Secondary | ICD-10-CM | POA: Diagnosis not present

## 2019-08-18 DIAGNOSIS — C7802 Secondary malignant neoplasm of left lung: Secondary | ICD-10-CM | POA: Diagnosis not present

## 2019-08-18 DIAGNOSIS — D630 Anemia in neoplastic disease: Secondary | ICD-10-CM | POA: Diagnosis not present

## 2019-08-18 DIAGNOSIS — C7972 Secondary malignant neoplasm of left adrenal gland: Secondary | ICD-10-CM | POA: Diagnosis not present

## 2019-08-18 DIAGNOSIS — R0600 Dyspnea, unspecified: Secondary | ICD-10-CM | POA: Diagnosis not present

## 2019-08-20 DIAGNOSIS — D630 Anemia in neoplastic disease: Secondary | ICD-10-CM | POA: Diagnosis not present

## 2019-08-20 DIAGNOSIS — C7972 Secondary malignant neoplasm of left adrenal gland: Secondary | ICD-10-CM | POA: Diagnosis not present

## 2019-08-20 DIAGNOSIS — C3491 Malignant neoplasm of unspecified part of right bronchus or lung: Secondary | ICD-10-CM | POA: Diagnosis not present

## 2019-08-20 DIAGNOSIS — I2510 Atherosclerotic heart disease of native coronary artery without angina pectoris: Secondary | ICD-10-CM | POA: Diagnosis not present

## 2019-08-20 DIAGNOSIS — R0600 Dyspnea, unspecified: Secondary | ICD-10-CM | POA: Diagnosis not present

## 2019-08-20 DIAGNOSIS — C7802 Secondary malignant neoplasm of left lung: Secondary | ICD-10-CM | POA: Diagnosis not present

## 2019-08-21 LAB — GUARDANT 360

## 2019-08-27 DIAGNOSIS — C3491 Malignant neoplasm of unspecified part of right bronchus or lung: Secondary | ICD-10-CM | POA: Diagnosis not present

## 2019-08-27 DIAGNOSIS — D630 Anemia in neoplastic disease: Secondary | ICD-10-CM | POA: Diagnosis not present

## 2019-08-27 DIAGNOSIS — R0600 Dyspnea, unspecified: Secondary | ICD-10-CM | POA: Diagnosis not present

## 2019-08-27 DIAGNOSIS — C7972 Secondary malignant neoplasm of left adrenal gland: Secondary | ICD-10-CM | POA: Diagnosis not present

## 2019-08-27 DIAGNOSIS — C7802 Secondary malignant neoplasm of left lung: Secondary | ICD-10-CM | POA: Diagnosis not present

## 2019-08-27 DIAGNOSIS — I2510 Atherosclerotic heart disease of native coronary artery without angina pectoris: Secondary | ICD-10-CM | POA: Diagnosis not present

## 2019-09-03 DIAGNOSIS — I2510 Atherosclerotic heart disease of native coronary artery without angina pectoris: Secondary | ICD-10-CM | POA: Diagnosis not present

## 2019-09-03 DIAGNOSIS — D630 Anemia in neoplastic disease: Secondary | ICD-10-CM | POA: Diagnosis not present

## 2019-09-03 DIAGNOSIS — C3491 Malignant neoplasm of unspecified part of right bronchus or lung: Secondary | ICD-10-CM | POA: Diagnosis not present

## 2019-09-03 DIAGNOSIS — R0600 Dyspnea, unspecified: Secondary | ICD-10-CM | POA: Diagnosis not present

## 2019-09-03 DIAGNOSIS — C7802 Secondary malignant neoplasm of left lung: Secondary | ICD-10-CM | POA: Diagnosis not present

## 2019-09-03 DIAGNOSIS — C7972 Secondary malignant neoplasm of left adrenal gland: Secondary | ICD-10-CM | POA: Diagnosis not present

## 2019-09-13 DIAGNOSIS — R188 Other ascites: Secondary | ICD-10-CM | POA: Diagnosis not present

## 2019-09-13 DIAGNOSIS — D630 Anemia in neoplastic disease: Secondary | ICD-10-CM | POA: Diagnosis not present

## 2019-09-13 DIAGNOSIS — C7802 Secondary malignant neoplasm of left lung: Secondary | ICD-10-CM | POA: Diagnosis not present

## 2019-09-13 DIAGNOSIS — F419 Anxiety disorder, unspecified: Secondary | ICD-10-CM | POA: Diagnosis not present

## 2019-09-13 DIAGNOSIS — F329 Major depressive disorder, single episode, unspecified: Secondary | ICD-10-CM | POA: Diagnosis not present

## 2019-09-13 DIAGNOSIS — C3491 Malignant neoplasm of unspecified part of right bronchus or lung: Secondary | ICD-10-CM | POA: Diagnosis not present

## 2019-09-13 DIAGNOSIS — I10 Essential (primary) hypertension: Secondary | ICD-10-CM | POA: Diagnosis not present

## 2019-09-13 DIAGNOSIS — K219 Gastro-esophageal reflux disease without esophagitis: Secondary | ICD-10-CM | POA: Diagnosis not present

## 2019-09-13 DIAGNOSIS — G2581 Restless legs syndrome: Secondary | ICD-10-CM | POA: Diagnosis not present

## 2019-09-13 DIAGNOSIS — E785 Hyperlipidemia, unspecified: Secondary | ICD-10-CM | POA: Diagnosis not present

## 2019-09-13 DIAGNOSIS — C7972 Secondary malignant neoplasm of left adrenal gland: Secondary | ICD-10-CM | POA: Diagnosis not present

## 2019-09-13 DIAGNOSIS — R0600 Dyspnea, unspecified: Secondary | ICD-10-CM | POA: Diagnosis not present

## 2019-09-13 DIAGNOSIS — I2510 Atherosclerotic heart disease of native coronary artery without angina pectoris: Secondary | ICD-10-CM | POA: Diagnosis not present

## 2019-09-15 ENCOUNTER — Ambulatory Visit (INDEPENDENT_AMBULATORY_CARE_PROVIDER_SITE_OTHER): Payer: Medicare Other

## 2019-09-15 ENCOUNTER — Other Ambulatory Visit: Payer: Self-pay

## 2019-09-15 DIAGNOSIS — Z0000 Encounter for general adult medical examination without abnormal findings: Secondary | ICD-10-CM

## 2019-09-15 NOTE — Patient Instructions (Signed)
[name] , Thank you for taking time to come for your Medicare Wellness Visit. I appreciate your ongoing commitment to your health goals. Please review the following plan we discussed and let me know if I can assist you in the future.   Screening recommendations/referrals: Colorectal Screening: Deferred  Mammogram: Deferred Bone Density: Deferred   Vision and Dental Exams: Recommended annual ophthalmology exams for early detection of glaucoma and other disorders of the eye Recommended annual dental exams for proper oral hygiene  Vaccinations: Influenza vaccine: I will be in touch with options to complete this  Pneumococcal vaccine: up to date; last 01/10/16  Advanced directives:We have received a copy of your POA (Power of [place]) and/or Living Will. These documents can be located in your chart.  Goals: Pace self to decrease fatigue.  Take time for things that you enjoy.  Next appointment: Please schedule your Annual Wellness Visit with your Nurse Health Advisor in one year.  Preventive Care 73 Years and Older, Female Preventive care refers to lifestyle choices and visits with your health care provider that can promote health and wellness. What does preventive care include?  A yearly physical exam. This is also called an annual well check.  Dental exams once or twice a year.  Routine eye exams. Ask your health care provider how often you should have your eyes checked.  Personal lifestyle choices, including:  Daily care of your teeth and gums.  Regular physical activity.  Eating a healthy diet.  Avoiding tobacco and drug use.  Limiting alcohol use.  Practicing safe sex.  Taking low-dose aspirin every day if recommended by your health care provider.  Taking vitamin and mineral supplements as recommended by your health care provider. What happens during an annual well check? The services and screenings done by your health care provider during your annual well check  will depend on your age, overall health, lifestyle risk factors, and family history of disease. Counseling  Your health care provider may ask you questions about your:  Alcohol use.  Tobacco use.  Drug use.  Emotional well-being.  Home and relationship well-being.  Sexual activity.  Eating habits.  History of falls.  Memory and ability to understand (cognition).  Work and work Statistician.  Reproductive health. Screening  You may have the following tests or measurements:  Height, weight, and BMI.  Blood pressure.  Lipid and cholesterol levels. These may be checked every 5 years, or more frequently if you are over 13 years old.  Skin check.  Lung cancer screening. You may have this screening every year starting at age 76 if you have a 30-pack-year history of smoking and currently smoke or have quit within the past 15 years.  Fecal occult blood test (FOBT) of the stool. You may have this test every year starting at age 13.  Flexible sigmoidoscopy or colonoscopy. You may have a sigmoidoscopy every 5 years or a colonoscopy every 10 years starting at age 49.  Hepatitis C blood test.  Hepatitis B blood test.  Sexually transmitted disease (STD) testing.  Diabetes screening. This is done by checking your blood sugar (glucose) after you have not eaten for a while (fasting). You may have this done every 1-3 years.  Bone density scan. This is done to screen for osteoporosis. You may have this done starting at age 74.  Mammogram. This may be done every 1-2 years. Talk to your health care provider about how often you should have regular mammograms. Talk with your health care  provider about your test results, treatment options, and if necessary, the need for more tests. Vaccines  Your health care provider may recommend certain vaccines, such as:  Influenza vaccine. This is recommended every year.  Tetanus, diphtheria, and acellular pertussis (Tdap, Td) vaccine. You may  need a Td booster every 10 years.  Zoster vaccine. You may need this after age 70.  Pneumococcal 13-valent conjugate (PCV13) vaccine. One dose is recommended after age 19.  Pneumococcal polysaccharide (PPSV23) vaccine. One dose is recommended after age 74. Talk to your health care provider about which screenings and vaccines you need and how often you need them. This information is not intended to replace advice given to you by your health care provider. Make sure you discuss any questions you have with your health care provider. Document Released: 11/25/2015 Document Revised: 07/18/2016 Document Reviewed: 08/30/2015 Elsevier Interactive Patient Education  2017 [employer] Prevention in the Home Falls can cause injuries. They can happen to people of all ages. There are many things you can do to make your home safe and to help prevent falls. What can I do on the outside of my home?  Regularly fix the edges of walkways and driveways and fix any cracks.  Remove anything that might make you trip as you walk through a door, such as a raised step or threshold.  Trim any bushes or trees on the path to your home.  Use bright outdoor lighting.  Clear any walking paths of anything that might make someone trip, such as rocks or tools.  Regularly check to see if handrails are loose or broken. Make sure that both sides of any steps have handrails.  Any raised decks and porches should have guardrails on the edges.  Have any leaves, snow, or ice cleared regularly.  Use sand or salt on walking paths during winter.  Clean up any spills in your garage right away. This includes oil or grease spills. What can I do in the bathroom?  Use night lights.  Install grab bars by the toilet and in the tub and shower. Do not use towel bars as grab bars.  Use non-skid mats or decals in the tub or shower.  If you need to sit down in the shower, use a plastic, non-slip stool.  Keep the floor  dry. Clean up any water that spills on the floor as soon as it happens.  Remove soap buildup in the tub or shower regularly.  Attach bath mats securely with double-sided non-slip rug tape.  Do not have throw rugs and other things on the floor that can make you trip. What can I do in the bedroom?  Use night lights.  Make sure that you have a light by your bed that is easy to reach.  Do not use any sheets or blankets that are too big for your bed. They should not hang down onto the floor.  Have a firm chair that has side arms. You can use this for support while you get dressed.  Do not have throw rugs and other things on the floor that can make you trip. What can I do in the kitchen?  Clean up any spills right away.  Avoid walking on wet floors.  Keep items that you use a lot in easy-to-reach places.  If you need to reach something above you, use a strong step stool that has a grab bar.  Keep electrical cords out of the way.  Do not use floor polish  or wax that makes floors slippery. If you must use wax, use non-skid floor wax.  Do not have throw rugs and other things on the floor that can make you trip. What can I do with my stairs?  Do not leave any items on the stairs.  Make sure that there are handrails on both sides of the stairs and use them. Fix handrails that are broken or loose. Make sure that handrails are as long as the stairways.  Check any carpeting to make sure that it is firmly attached to the stairs. Fix any carpet that is loose or worn.  Avoid having throw rugs at the top or bottom of the stairs. If you do have throw rugs, attach them to the floor with carpet tape.  Make sure that you have a light switch at the top of the stairs and the bottom of the stairs. If you do not have them, ask someone to add them for you. What else can I do to help prevent falls?  Wear shoes that:  Do not have high heels.  Have rubber bottoms.  Are comfortable and fit you  well.  Are closed at the toe. Do not wear sandals.  If you use a stepladder:  Make sure that it is fully opened. Do not climb a closed stepladder.  Make sure that both sides of the stepladder are locked into place.  Ask someone to hold it for you, if possible.  Clearly mark and make sure that you can see:  Any grab bars or handrails.  First and last steps.  Where the edge of each step is.  Use tools that help you move around (mobility aids) if they are needed. These include:  Canes.  Walkers.  Scooters.  Crutches.  Turn on the lights when you go into a dark area. Replace any light bulbs as soon as they burn out.  Set up your furniture so you have a clear path. Avoid moving your furniture around.  If any of your floors are uneven, fix them.  If there are any pets around you, be aware of where they are.  Review your medicines with your doctor. Some medicines can make you feel dizzy. This can increase your chance of falling. Ask your doctor what other things that you can do to help prevent falls. This information is not intended to replace advice given to you by your health care provider. Make sure you discuss any questions you have with your health care provider. Document Released: 08/25/2009 Document Revised: 04/05/2016 Document Reviewed: 12/03/2014 Elsevier Interactive Patient Education  2017 [employer].

## 2019-09-15 NOTE — Progress Notes (Signed)
I have reviewed the documentation from the recent AWV done by Courtney Slade, RN; I agree with the documentation and will follow up on any recommendations or abnormal findings as suggested.  

## 2019-09-15 NOTE — Progress Notes (Signed)
I connected with Forestine Na on 09/15/19 at 0900 by phone and verified that I am speaking with the correct person using two identifiers. Location patient: Home Location provider: [facility], Office Persons participating in the virtual visit: Denman George LPN and Dr. Billey Chang    I discussed the limitations of evaluation and management by telemedicine and the availability of in person appointments. The patient expressed understanding and agreed to proceed.  Subjective:   Latoya Mcfarland is a 73 y.o. female who presents for Medicare Annual (Subsequent) preventive examination.  Review of Systems:   Cardiac Risk Factors include: advanced age (>29men, >49 women)    Objective:     Vitals: LMP  (LMP Unknown)   There is no height or weight on file to calculate BMI.  Advanced Directives 09/15/2019 06/01/2019 05/25/2019  Does Patient Have a Medical Advance Directive? Yes Yes Yes  Type of Paramedic of [place];Living will [place];Living will [place];Living will  Does patient want to make changes to medical advance directive? No - Patient declined No - Patient declined -  Copy of [place] in Chart? Yes - validated most recent copy scanned in chart (See row information) No - copy requested No - copy requested    Tobacco Social History   Tobacco Use  Smoking Status Former Smoker  . Packs/day: 1.00  . Years: 50.00  . Pack years: 50.00  . Types: Cigarettes  . Quit date: 04/19/2019  . Years since quitting: 0.4  Smokeless Tobacco Never Used     Counseling given: Not Answered   Clinical Intake:  Pre-visit preparation completed: Yes  Pain : No/denies pain  Diabetes: No  How often do you need to have someone help you when you read instructions, pamphlets, or other written materials from your doctor or pharmacy?: 1 - Never  Interpreter Needed?: No  Information entered by :: Denman George  LPN  Past Medical History:  Diagnosis Date  . Anemia   . Anxiety   . 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 Depression   . Diverticulitis   . GERD (gastroesophageal reflux disease)   . Heart murmur   . History of chicken pox   . Hyperlipidemia   . Hypertension    Past Surgical History:  Procedure Laterality Date  . ABDOMINAL HYSTERECTOMY  1992  . APPENDECTOMY  1961  . CHOLECYSTECTOMY  2000  . TONSILLECTOMY AND ADENOIDECTOMY  1959   Family History  Problem Relation Age of Onset  . COPD Mother   . Heart attack Father   . Stroke Brother   . Colon cancer Neg Hx   . Colon polyps Neg Hx   . Esophageal cancer Neg Hx   . Stomach cancer Neg Hx   . Rectal cancer Neg Hx    Social History   Socioeconomic History  . Marital status: Widowed    Spouse name: Not on file  . Number of children: Not on file  . Years of education: Not on file  . Highest education level: Not on file  Occupational History  . Not on file  Social Needs  . Financial resource strain: Not on file  . Food insecurity    Worry: Not on file    Inability: Not on file  . Transportation needs    Medical: Not on file    Non-medical: Not on file  Tobacco Use  . Smoking status: Former Smoker    Packs/day: 1.00  Years: 50.00    Pack years: 50.00    Types: Cigarettes    Quit date: 04/19/2019    Years since quitting: 0.4  . Smokeless tobacco: Never Used  Substance and Sexual Activity  . Alcohol use: Not Currently  . Drug use: Never  . Sexual activity: Not Currently  Lifestyle  . Physical activity    Days per week: Not on file    Minutes per session: Not on file  . Stress: Not on file  Relationships  . Social Herbalist on phone: Not on file    Gets together: Not on file    Attends religious service: Not on file    Active member of club or organization: Not on file    Attends meetings of clubs or organizations: Not on file    Relationship status: Not on file  Other Topics Concern  . Not  on file  Social History Narrative   Moved to the area from [place] after the death of her husband   Has 1 son and his family that live in [place]   Has friends her apartment complex that check in on her     Outpatient Encounter Medications as of 09/15/2019  Medication Sig  . ALPRAZolam (XANAX) 0.25 MG tablet Take 1 tablet (0.25 mg total) by mouth 2 (two) times daily as needed for anxiety.  . Cholecalciferol (VITAMIN D3 PO) Take by mouth daily.  Marland Kitchen lisinopril (ZESTRIL) 30 MG tablet Take 1 tablet (30 mg total) by mouth daily.  . Multiple Vitamins-Minerals (PRESERVISION AREDS 2 PO) Take by mouth 2 (two) times a day.  Marland Kitchen omeprazole (PRILOSEC) 40 MG capsule   . prochlorperazine (COMPAZINE) 10 MG tablet TK 1 T PO Q 4 H PRN  . rOPINIRole (REQUIP) 0.5 MG tablet Take 1 tablet (0.5 mg total) by mouth at bedtime for 7 days, THEN 2 tablets (1 mg total) at bedtime.  . temazepam (RESTORIL) 15 MG capsule TK ONE C PO QHS PRN. MAY REPEAT IN 1 HOUR  . [DISCONTINUED] aspirin EC 81 MG tablet Take 81 mg by mouth daily.   No facility-administered encounter medications on file as of 09/15/2019.     Activities of Daily Living In your present state of health, do you have any difficulty performing the following activities: 09/15/2019 06/01/2019  Hearing? N N  Vision? N N  Difficulty concentrating or making decisions? N N  Walking or climbing stairs? Y N  Comment increased shortness of breath with activity -  Dressing or bathing? N N  Doing errands, shopping? N -  Preparing Food and eating ? N -  Using the Toilet? N -  In the past six months, have you accidently leaked urine? N -  Do you have problems with loss of bowel control? N -  Managing your Medications? N -  Managing your Finances? N -  Housekeeping or managing your Housekeeping? N -  Some recent data might be hidden    Patient Care Team: Orma Flaming, MD as PCP - General (Family Medicine) Ardis Hughs, MD as Consulting Physician  (Urology) Valrie Hart, RN as Oncology Nurse Navigator Curt Bears, MD as Consulting Physician (Oncology) Ladene Artist, MD as Consulting Physician (Gastroenterology)    Assessment:   This is a routine wellness examination for [facility].  Exercise Activities and Dietary recommendations Current Exercise Habits: The patient does not participate in regular exercise at present  Goals   None     Fall Risk Fall Risk  09/15/2019 09/03/2018  Falls in the past year? 0 No  Injury with Fall? 0 -  Follow up Falls evaluation completed;Education provided;Falls prevention discussed -   Is the patient's home free of loose throw rugs in walkways, pet beds, electrical cords, etc?   yes      Grab bars in the bathroom? yes      Handrails on the stairs?   yes      Adequate lighting?   yes  Depression Screen PHQ 2/9 Scores 09/15/2019 02/26/2019 09/03/2018 09/03/2018  PHQ - 2 Score 0 0 0 0  PHQ- 9 Score - 2 0 -     Cognitive Function-no cognitive concerns at this time    6CIT Screen 09/15/2019  What Year? 0 points  What month? 0 points  What time? 0 points  Count back from 20 0 points  Months in reverse 0 points  Repeat phrase 0 points  Total Score 0    Immunization History  Administered Date(s) Administered  . Influenza-Unspecified 10/20/2014, 07/07/2015, 09/14/2016, 07/25/2018  . Pneumococcal Conjugate-13 12/30/2014  . Pneumococcal Polysaccharide-23 01/10/2016  . Zoster 04/22/2012    Qualifies for Shingles Vaccine? Not indicated   Screening Tests Health Maintenance  Topic Date Due  . TETANUS/TDAP  06/27/1965  . DEXA SCAN  06/28/2011  . COLONOSCOPY  02/26/2017  . COLON CANCER SCREENING ANNUAL FOBT  02/05/2018  . INFLUENZA VACCINE  06/13/2019  . MAMMOGRAM  09/29/2020  . Hepatitis C Screening  Completed  . PNA vac Low Risk Adult  Completed    Cancer Screenings: Lung: Low Dose CT Chest recommended if Age 30-80 years, 30 pack-year currently smoking OR have quit w/in  15years. Patient does not qualify. Breast:  Up to date on Mammogram? Yes   Up to date of Bone Density/Dexa? Yes Colorectal: No longer indicated  Plan:  I have personally reviewed and addressed the Medicare Annual Wellness questionnaire and have noted the following in the patient's chart:  A. Medical and social history B. Use of alcohol, tobacco or illicit drugs  C. Current medications and supplements D. Functional ability and status E.  Nutritional status F.  Physical activity G. Advance directives H. List of other physicians I.  Hospitalizations, surgeries, and ER visits in previous 12 months J.  [place] such as hearing and vision if needed, cognitive and depression L. Referrals, records requested, and appointments- assistance with receiving flu vaccine   In addition, I have reviewed and discussed with patient certain preventive protocols, quality metrics, and best practice recommendations. A written personalized care plan for preventive services as well as general preventive health recommendations were provided to patient.   Signed,  Denman George, LPN  Nurse Health Advisor   Nurse Notes: Will call hospice service to see if it is an option for them to administer her a flu shot.

## 2019-09-16 DIAGNOSIS — C3491 Malignant neoplasm of unspecified part of right bronchus or lung: Secondary | ICD-10-CM | POA: Diagnosis not present

## 2019-09-16 DIAGNOSIS — R0600 Dyspnea, unspecified: Secondary | ICD-10-CM | POA: Diagnosis not present

## 2019-09-16 DIAGNOSIS — D630 Anemia in neoplastic disease: Secondary | ICD-10-CM | POA: Diagnosis not present

## 2019-09-16 DIAGNOSIS — C7802 Secondary malignant neoplasm of left lung: Secondary | ICD-10-CM | POA: Diagnosis not present

## 2019-09-16 DIAGNOSIS — I2510 Atherosclerotic heart disease of native coronary artery without angina pectoris: Secondary | ICD-10-CM | POA: Diagnosis not present

## 2019-09-16 DIAGNOSIS — C7972 Secondary malignant neoplasm of left adrenal gland: Secondary | ICD-10-CM | POA: Diagnosis not present

## 2019-09-25 DIAGNOSIS — C7972 Secondary malignant neoplasm of left adrenal gland: Secondary | ICD-10-CM | POA: Diagnosis not present

## 2019-09-25 DIAGNOSIS — R0600 Dyspnea, unspecified: Secondary | ICD-10-CM | POA: Diagnosis not present

## 2019-09-25 DIAGNOSIS — I2510 Atherosclerotic heart disease of native coronary artery without angina pectoris: Secondary | ICD-10-CM | POA: Diagnosis not present

## 2019-09-25 DIAGNOSIS — C3491 Malignant neoplasm of unspecified part of right bronchus or lung: Secondary | ICD-10-CM | POA: Diagnosis not present

## 2019-09-25 DIAGNOSIS — D630 Anemia in neoplastic disease: Secondary | ICD-10-CM | POA: Diagnosis not present

## 2019-09-25 DIAGNOSIS — C7802 Secondary malignant neoplasm of left lung: Secondary | ICD-10-CM | POA: Diagnosis not present

## 2019-10-05 DIAGNOSIS — D630 Anemia in neoplastic disease: Secondary | ICD-10-CM | POA: Diagnosis not present

## 2019-10-05 DIAGNOSIS — C3491 Malignant neoplasm of unspecified part of right bronchus or lung: Secondary | ICD-10-CM | POA: Diagnosis not present

## 2019-10-05 DIAGNOSIS — R0600 Dyspnea, unspecified: Secondary | ICD-10-CM | POA: Diagnosis not present

## 2019-10-05 DIAGNOSIS — I2510 Atherosclerotic heart disease of native coronary artery without angina pectoris: Secondary | ICD-10-CM | POA: Diagnosis not present

## 2019-10-05 DIAGNOSIS — C7972 Secondary malignant neoplasm of left adrenal gland: Secondary | ICD-10-CM | POA: Diagnosis not present

## 2019-10-05 DIAGNOSIS — C7802 Secondary malignant neoplasm of left lung: Secondary | ICD-10-CM | POA: Diagnosis not present

## 2019-10-09 DIAGNOSIS — R0600 Dyspnea, unspecified: Secondary | ICD-10-CM | POA: Diagnosis not present

## 2019-10-09 DIAGNOSIS — I2510 Atherosclerotic heart disease of native coronary artery without angina pectoris: Secondary | ICD-10-CM | POA: Diagnosis not present

## 2019-10-09 DIAGNOSIS — C7802 Secondary malignant neoplasm of left lung: Secondary | ICD-10-CM | POA: Diagnosis not present

## 2019-10-09 DIAGNOSIS — C7972 Secondary malignant neoplasm of left adrenal gland: Secondary | ICD-10-CM | POA: Diagnosis not present

## 2019-10-09 DIAGNOSIS — C3491 Malignant neoplasm of unspecified part of right bronchus or lung: Secondary | ICD-10-CM | POA: Diagnosis not present

## 2019-10-09 DIAGNOSIS — D630 Anemia in neoplastic disease: Secondary | ICD-10-CM | POA: Diagnosis not present

## 2019-10-12 ENCOUNTER — Ambulatory Visit: Payer: Medicare Other | Admitting: Family Medicine

## 2019-10-21 ENCOUNTER — Encounter: Payer: Self-pay | Admitting: *Deleted

## 2019-11-11 ENCOUNTER — Encounter: Payer: Self-pay | Admitting: *Deleted

## 2019-11-13 DIAGNOSIS — I10 Essential (primary) hypertension: Secondary | ICD-10-CM | POA: Diagnosis not present

## 2019-11-13 DIAGNOSIS — G2581 Restless legs syndrome: Secondary | ICD-10-CM | POA: Diagnosis not present

## 2019-11-13 DIAGNOSIS — D630 Anemia in neoplastic disease: Secondary | ICD-10-CM | POA: Diagnosis not present

## 2019-11-13 DIAGNOSIS — F419 Anxiety disorder, unspecified: Secondary | ICD-10-CM | POA: Diagnosis not present

## 2019-11-13 DIAGNOSIS — R188 Other ascites: Secondary | ICD-10-CM | POA: Diagnosis not present

## 2019-11-13 DIAGNOSIS — C7802 Secondary malignant neoplasm of left lung: Secondary | ICD-10-CM | POA: Diagnosis not present

## 2019-11-13 DIAGNOSIS — C7972 Secondary malignant neoplasm of left adrenal gland: Secondary | ICD-10-CM | POA: Diagnosis not present

## 2019-11-13 DIAGNOSIS — K219 Gastro-esophageal reflux disease without esophagitis: Secondary | ICD-10-CM | POA: Diagnosis not present

## 2019-11-13 DIAGNOSIS — F329 Major depressive disorder, single episode, unspecified: Secondary | ICD-10-CM | POA: Diagnosis not present

## 2019-11-13 DIAGNOSIS — E785 Hyperlipidemia, unspecified: Secondary | ICD-10-CM | POA: Diagnosis not present

## 2019-11-13 DIAGNOSIS — R0600 Dyspnea, unspecified: Secondary | ICD-10-CM | POA: Diagnosis not present

## 2019-11-13 DIAGNOSIS — I2510 Atherosclerotic heart disease of native coronary artery without angina pectoris: Secondary | ICD-10-CM | POA: Diagnosis not present

## 2019-11-13 DIAGNOSIS — C3491 Malignant neoplasm of unspecified part of right bronchus or lung: Secondary | ICD-10-CM | POA: Diagnosis not present

## 2019-11-18 DIAGNOSIS — C7802 Secondary malignant neoplasm of left lung: Secondary | ICD-10-CM | POA: Diagnosis not present

## 2019-11-18 DIAGNOSIS — D630 Anemia in neoplastic disease: Secondary | ICD-10-CM | POA: Diagnosis not present

## 2019-11-18 DIAGNOSIS — C3491 Malignant neoplasm of unspecified part of right bronchus or lung: Secondary | ICD-10-CM | POA: Diagnosis not present

## 2019-11-18 DIAGNOSIS — I2510 Atherosclerotic heart disease of native coronary artery without angina pectoris: Secondary | ICD-10-CM | POA: Diagnosis not present

## 2019-11-18 DIAGNOSIS — R0600 Dyspnea, unspecified: Secondary | ICD-10-CM | POA: Diagnosis not present

## 2019-11-18 DIAGNOSIS — C7972 Secondary malignant neoplasm of left adrenal gland: Secondary | ICD-10-CM | POA: Diagnosis not present

## 2019-11-25 DIAGNOSIS — R0600 Dyspnea, unspecified: Secondary | ICD-10-CM | POA: Diagnosis not present

## 2019-11-25 DIAGNOSIS — C7802 Secondary malignant neoplasm of left lung: Secondary | ICD-10-CM | POA: Diagnosis not present

## 2019-11-25 DIAGNOSIS — D630 Anemia in neoplastic disease: Secondary | ICD-10-CM | POA: Diagnosis not present

## 2019-11-25 DIAGNOSIS — I2510 Atherosclerotic heart disease of native coronary artery without angina pectoris: Secondary | ICD-10-CM | POA: Diagnosis not present

## 2019-11-25 DIAGNOSIS — C7972 Secondary malignant neoplasm of left adrenal gland: Secondary | ICD-10-CM | POA: Diagnosis not present

## 2019-11-25 DIAGNOSIS — C3491 Malignant neoplasm of unspecified part of right bronchus or lung: Secondary | ICD-10-CM | POA: Diagnosis not present

## 2019-12-02 ENCOUNTER — Telehealth: Payer: Self-pay | Admitting: Family Medicine

## 2019-12-02 DIAGNOSIS — I2510 Atherosclerotic heart disease of native coronary artery without angina pectoris: Secondary | ICD-10-CM | POA: Diagnosis not present

## 2019-12-02 DIAGNOSIS — D630 Anemia in neoplastic disease: Secondary | ICD-10-CM | POA: Diagnosis not present

## 2019-12-02 DIAGNOSIS — R0600 Dyspnea, unspecified: Secondary | ICD-10-CM | POA: Diagnosis not present

## 2019-12-02 DIAGNOSIS — C7802 Secondary malignant neoplasm of left lung: Secondary | ICD-10-CM | POA: Diagnosis not present

## 2019-12-02 DIAGNOSIS — C3491 Malignant neoplasm of unspecified part of right bronchus or lung: Secondary | ICD-10-CM | POA: Diagnosis not present

## 2019-12-02 DIAGNOSIS — C7972 Secondary malignant neoplasm of left adrenal gland: Secondary | ICD-10-CM | POA: Diagnosis not present

## 2019-12-02 NOTE — Telephone Encounter (Signed)
Happy to sign and once completed. Can check - cannot walk 200 feet option without stopping to rest option

## 2019-12-02 NOTE — Telephone Encounter (Signed)
We can do. She has stage 4 lung cancer.r please have another provider in office fill out for me. Sending to Dr. Jonni Sanger and Dr. Yong Channel as well.  Greatly appreciated.  Orma Flaming, MD [place]

## 2019-12-02 NOTE — Telephone Encounter (Signed)
Pt called requesting to be given a handi-capped tag. Please advise.

## 2019-12-03 NOTE — Telephone Encounter (Signed)
Spoke to pt told her parking placard form is ready for pick up will put at the front desk for you to pickup. Pt verbalized understanding. Copy made to scan to chart.

## 2019-12-07 DIAGNOSIS — C3491 Malignant neoplasm of unspecified part of right bronchus or lung: Secondary | ICD-10-CM | POA: Diagnosis not present

## 2019-12-07 DIAGNOSIS — C7972 Secondary malignant neoplasm of left adrenal gland: Secondary | ICD-10-CM | POA: Diagnosis not present

## 2019-12-07 DIAGNOSIS — D630 Anemia in neoplastic disease: Secondary | ICD-10-CM | POA: Diagnosis not present

## 2019-12-07 DIAGNOSIS — R0600 Dyspnea, unspecified: Secondary | ICD-10-CM | POA: Diagnosis not present

## 2019-12-07 DIAGNOSIS — C7802 Secondary malignant neoplasm of left lung: Secondary | ICD-10-CM | POA: Diagnosis not present

## 2019-12-07 DIAGNOSIS — I2510 Atherosclerotic heart disease of native coronary artery without angina pectoris: Secondary | ICD-10-CM | POA: Diagnosis not present

## 2019-12-09 DIAGNOSIS — C7972 Secondary malignant neoplasm of left adrenal gland: Secondary | ICD-10-CM | POA: Diagnosis not present

## 2019-12-09 DIAGNOSIS — C3491 Malignant neoplasm of unspecified part of right bronchus or lung: Secondary | ICD-10-CM | POA: Diagnosis not present

## 2019-12-09 DIAGNOSIS — D630 Anemia in neoplastic disease: Secondary | ICD-10-CM | POA: Diagnosis not present

## 2019-12-09 DIAGNOSIS — R0600 Dyspnea, unspecified: Secondary | ICD-10-CM | POA: Diagnosis not present

## 2019-12-09 DIAGNOSIS — I2510 Atherosclerotic heart disease of native coronary artery without angina pectoris: Secondary | ICD-10-CM | POA: Diagnosis not present

## 2019-12-09 DIAGNOSIS — C7802 Secondary malignant neoplasm of left lung: Secondary | ICD-10-CM | POA: Diagnosis not present

## 2019-12-14 DIAGNOSIS — E785 Hyperlipidemia, unspecified: Secondary | ICD-10-CM | POA: Diagnosis not present

## 2019-12-14 DIAGNOSIS — C3491 Malignant neoplasm of unspecified part of right bronchus or lung: Secondary | ICD-10-CM | POA: Diagnosis not present

## 2019-12-14 DIAGNOSIS — C7802 Secondary malignant neoplasm of left lung: Secondary | ICD-10-CM | POA: Diagnosis not present

## 2019-12-14 DIAGNOSIS — F329 Major depressive disorder, single episode, unspecified: Secondary | ICD-10-CM | POA: Diagnosis not present

## 2019-12-14 DIAGNOSIS — F419 Anxiety disorder, unspecified: Secondary | ICD-10-CM | POA: Diagnosis not present

## 2019-12-14 DIAGNOSIS — I10 Essential (primary) hypertension: Secondary | ICD-10-CM | POA: Diagnosis not present

## 2019-12-14 DIAGNOSIS — C7972 Secondary malignant neoplasm of left adrenal gland: Secondary | ICD-10-CM | POA: Diagnosis not present

## 2019-12-14 DIAGNOSIS — G2581 Restless legs syndrome: Secondary | ICD-10-CM | POA: Diagnosis not present

## 2019-12-14 DIAGNOSIS — I2510 Atherosclerotic heart disease of native coronary artery without angina pectoris: Secondary | ICD-10-CM | POA: Diagnosis not present

## 2019-12-14 DIAGNOSIS — D630 Anemia in neoplastic disease: Secondary | ICD-10-CM | POA: Diagnosis not present

## 2019-12-14 DIAGNOSIS — K219 Gastro-esophageal reflux disease without esophagitis: Secondary | ICD-10-CM | POA: Diagnosis not present

## 2019-12-14 DIAGNOSIS — R0600 Dyspnea, unspecified: Secondary | ICD-10-CM | POA: Diagnosis not present

## 2019-12-14 DIAGNOSIS — R188 Other ascites: Secondary | ICD-10-CM | POA: Diagnosis not present

## 2019-12-23 DIAGNOSIS — C3491 Malignant neoplasm of unspecified part of right bronchus or lung: Secondary | ICD-10-CM | POA: Diagnosis not present

## 2019-12-23 DIAGNOSIS — C7972 Secondary malignant neoplasm of left adrenal gland: Secondary | ICD-10-CM | POA: Diagnosis not present

## 2019-12-23 DIAGNOSIS — R0600 Dyspnea, unspecified: Secondary | ICD-10-CM | POA: Diagnosis not present

## 2019-12-23 DIAGNOSIS — D630 Anemia in neoplastic disease: Secondary | ICD-10-CM | POA: Diagnosis not present

## 2019-12-23 DIAGNOSIS — I2510 Atherosclerotic heart disease of native coronary artery without angina pectoris: Secondary | ICD-10-CM | POA: Diagnosis not present

## 2019-12-23 DIAGNOSIS — C7802 Secondary malignant neoplasm of left lung: Secondary | ICD-10-CM | POA: Diagnosis not present

## 2019-12-30 DIAGNOSIS — I2510 Atherosclerotic heart disease of native coronary artery without angina pectoris: Secondary | ICD-10-CM | POA: Diagnosis not present

## 2019-12-30 DIAGNOSIS — R0600 Dyspnea, unspecified: Secondary | ICD-10-CM | POA: Diagnosis not present

## 2019-12-30 DIAGNOSIS — D630 Anemia in neoplastic disease: Secondary | ICD-10-CM | POA: Diagnosis not present

## 2019-12-30 DIAGNOSIS — C7802 Secondary malignant neoplasm of left lung: Secondary | ICD-10-CM | POA: Diagnosis not present

## 2019-12-30 DIAGNOSIS — C7972 Secondary malignant neoplasm of left adrenal gland: Secondary | ICD-10-CM | POA: Diagnosis not present

## 2019-12-30 DIAGNOSIS — C3491 Malignant neoplasm of unspecified part of right bronchus or lung: Secondary | ICD-10-CM | POA: Diagnosis not present

## 2020-01-01 DIAGNOSIS — I2510 Atherosclerotic heart disease of native coronary artery without angina pectoris: Secondary | ICD-10-CM | POA: Diagnosis not present

## 2020-01-01 DIAGNOSIS — R0600 Dyspnea, unspecified: Secondary | ICD-10-CM | POA: Diagnosis not present

## 2020-01-01 DIAGNOSIS — C7972 Secondary malignant neoplasm of left adrenal gland: Secondary | ICD-10-CM | POA: Diagnosis not present

## 2020-01-01 DIAGNOSIS — C3491 Malignant neoplasm of unspecified part of right bronchus or lung: Secondary | ICD-10-CM | POA: Diagnosis not present

## 2020-01-01 DIAGNOSIS — D630 Anemia in neoplastic disease: Secondary | ICD-10-CM | POA: Diagnosis not present

## 2020-01-01 DIAGNOSIS — C7802 Secondary malignant neoplasm of left lung: Secondary | ICD-10-CM | POA: Diagnosis not present

## 2020-01-06 DIAGNOSIS — I2510 Atherosclerotic heart disease of native coronary artery without angina pectoris: Secondary | ICD-10-CM | POA: Diagnosis not present

## 2020-01-06 DIAGNOSIS — C7972 Secondary malignant neoplasm of left adrenal gland: Secondary | ICD-10-CM | POA: Diagnosis not present

## 2020-01-06 DIAGNOSIS — R0600 Dyspnea, unspecified: Secondary | ICD-10-CM | POA: Diagnosis not present

## 2020-01-06 DIAGNOSIS — D630 Anemia in neoplastic disease: Secondary | ICD-10-CM | POA: Diagnosis not present

## 2020-01-06 DIAGNOSIS — C3491 Malignant neoplasm of unspecified part of right bronchus or lung: Secondary | ICD-10-CM | POA: Diagnosis not present

## 2020-01-06 DIAGNOSIS — C7802 Secondary malignant neoplasm of left lung: Secondary | ICD-10-CM | POA: Diagnosis not present

## 2020-01-07 DIAGNOSIS — C7972 Secondary malignant neoplasm of left adrenal gland: Secondary | ICD-10-CM | POA: Diagnosis not present

## 2020-01-07 DIAGNOSIS — I2510 Atherosclerotic heart disease of native coronary artery without angina pectoris: Secondary | ICD-10-CM | POA: Diagnosis not present

## 2020-01-07 DIAGNOSIS — R0600 Dyspnea, unspecified: Secondary | ICD-10-CM | POA: Diagnosis not present

## 2020-01-07 DIAGNOSIS — D630 Anemia in neoplastic disease: Secondary | ICD-10-CM | POA: Diagnosis not present

## 2020-01-07 DIAGNOSIS — C7802 Secondary malignant neoplasm of left lung: Secondary | ICD-10-CM | POA: Diagnosis not present

## 2020-01-07 DIAGNOSIS — C3491 Malignant neoplasm of unspecified part of right bronchus or lung: Secondary | ICD-10-CM | POA: Diagnosis not present

## 2020-01-08 DIAGNOSIS — I2510 Atherosclerotic heart disease of native coronary artery without angina pectoris: Secondary | ICD-10-CM | POA: Diagnosis not present

## 2020-01-08 DIAGNOSIS — D630 Anemia in neoplastic disease: Secondary | ICD-10-CM | POA: Diagnosis not present

## 2020-01-08 DIAGNOSIS — C3491 Malignant neoplasm of unspecified part of right bronchus or lung: Secondary | ICD-10-CM | POA: Diagnosis not present

## 2020-01-08 DIAGNOSIS — C7802 Secondary malignant neoplasm of left lung: Secondary | ICD-10-CM | POA: Diagnosis not present

## 2020-01-08 DIAGNOSIS — C7972 Secondary malignant neoplasm of left adrenal gland: Secondary | ICD-10-CM | POA: Diagnosis not present

## 2020-01-08 DIAGNOSIS — R0600 Dyspnea, unspecified: Secondary | ICD-10-CM | POA: Diagnosis not present

## 2020-01-11 DIAGNOSIS — C7802 Secondary malignant neoplasm of left lung: Secondary | ICD-10-CM | POA: Diagnosis not present

## 2020-01-11 DIAGNOSIS — D630 Anemia in neoplastic disease: Secondary | ICD-10-CM | POA: Diagnosis not present

## 2020-01-11 DIAGNOSIS — R188 Other ascites: Secondary | ICD-10-CM | POA: Diagnosis not present

## 2020-01-11 DIAGNOSIS — K219 Gastro-esophageal reflux disease without esophagitis: Secondary | ICD-10-CM | POA: Diagnosis not present

## 2020-01-11 DIAGNOSIS — F419 Anxiety disorder, unspecified: Secondary | ICD-10-CM | POA: Diagnosis not present

## 2020-01-11 DIAGNOSIS — I10 Essential (primary) hypertension: Secondary | ICD-10-CM | POA: Diagnosis not present

## 2020-01-11 DIAGNOSIS — C7972 Secondary malignant neoplasm of left adrenal gland: Secondary | ICD-10-CM | POA: Diagnosis not present

## 2020-01-11 DIAGNOSIS — R110 Nausea: Secondary | ICD-10-CM | POA: Diagnosis not present

## 2020-01-11 DIAGNOSIS — G2581 Restless legs syndrome: Secondary | ICD-10-CM | POA: Diagnosis not present

## 2020-01-11 DIAGNOSIS — F329 Major depressive disorder, single episode, unspecified: Secondary | ICD-10-CM | POA: Diagnosis not present

## 2020-01-11 DIAGNOSIS — R0600 Dyspnea, unspecified: Secondary | ICD-10-CM | POA: Diagnosis not present

## 2020-01-11 DIAGNOSIS — C3491 Malignant neoplasm of unspecified part of right bronchus or lung: Secondary | ICD-10-CM | POA: Diagnosis not present

## 2020-01-11 DIAGNOSIS — E785 Hyperlipidemia, unspecified: Secondary | ICD-10-CM | POA: Diagnosis not present

## 2020-01-11 DIAGNOSIS — I2510 Atherosclerotic heart disease of native coronary artery without angina pectoris: Secondary | ICD-10-CM | POA: Diagnosis not present

## 2020-01-12 DIAGNOSIS — D630 Anemia in neoplastic disease: Secondary | ICD-10-CM | POA: Diagnosis not present

## 2020-01-12 DIAGNOSIS — I2510 Atherosclerotic heart disease of native coronary artery without angina pectoris: Secondary | ICD-10-CM | POA: Diagnosis not present

## 2020-01-12 DIAGNOSIS — R0600 Dyspnea, unspecified: Secondary | ICD-10-CM | POA: Diagnosis not present

## 2020-01-12 DIAGNOSIS — C7972 Secondary malignant neoplasm of left adrenal gland: Secondary | ICD-10-CM | POA: Diagnosis not present

## 2020-01-12 DIAGNOSIS — C3491 Malignant neoplasm of unspecified part of right bronchus or lung: Secondary | ICD-10-CM | POA: Diagnosis not present

## 2020-01-12 DIAGNOSIS — C7802 Secondary malignant neoplasm of left lung: Secondary | ICD-10-CM | POA: Diagnosis not present

## 2020-01-13 DIAGNOSIS — C7802 Secondary malignant neoplasm of left lung: Secondary | ICD-10-CM | POA: Diagnosis not present

## 2020-01-13 DIAGNOSIS — C7972 Secondary malignant neoplasm of left adrenal gland: Secondary | ICD-10-CM | POA: Diagnosis not present

## 2020-01-13 DIAGNOSIS — R0600 Dyspnea, unspecified: Secondary | ICD-10-CM | POA: Diagnosis not present

## 2020-01-13 DIAGNOSIS — D630 Anemia in neoplastic disease: Secondary | ICD-10-CM | POA: Diagnosis not present

## 2020-01-13 DIAGNOSIS — I2510 Atherosclerotic heart disease of native coronary artery without angina pectoris: Secondary | ICD-10-CM | POA: Diagnosis not present

## 2020-01-13 DIAGNOSIS — C3491 Malignant neoplasm of unspecified part of right bronchus or lung: Secondary | ICD-10-CM | POA: Diagnosis not present

## 2020-01-14 ENCOUNTER — Telehealth: Payer: Self-pay | Admitting: Medical Oncology

## 2020-01-14 DIAGNOSIS — R0600 Dyspnea, unspecified: Secondary | ICD-10-CM | POA: Diagnosis not present

## 2020-01-14 DIAGNOSIS — C7972 Secondary malignant neoplasm of left adrenal gland: Secondary | ICD-10-CM | POA: Diagnosis not present

## 2020-01-14 DIAGNOSIS — C3491 Malignant neoplasm of unspecified part of right bronchus or lung: Secondary | ICD-10-CM | POA: Diagnosis not present

## 2020-01-14 DIAGNOSIS — D630 Anemia in neoplastic disease: Secondary | ICD-10-CM | POA: Diagnosis not present

## 2020-01-14 DIAGNOSIS — C7802 Secondary malignant neoplasm of left lung: Secondary | ICD-10-CM | POA: Diagnosis not present

## 2020-01-14 DIAGNOSIS — I2510 Atherosclerotic heart disease of native coronary artery without angina pectoris: Secondary | ICD-10-CM | POA: Diagnosis not present

## 2020-02-11 NOTE — Telephone Encounter (Signed)
Pt died on 02-11-2020 at 1207am

## 2020-02-11 DEATH — deceased

## 2020-07-28 IMAGING — CT CT BIOPSY
1 of 4 series · 10 of 32 positions shown, 13 images · non-contrast
Comparison: none

INDICATION: 72-year-old female with left adrenal mass. She presents for biopsy
to facilitate tissue diagnosis.

[Series 2: i-spiral 5.0 b40f · axial · 0.98mm/px · z∈[-126,-4]mm · 10 of 45 slices shown, 13 images]
[im 5/45  mediastinal]
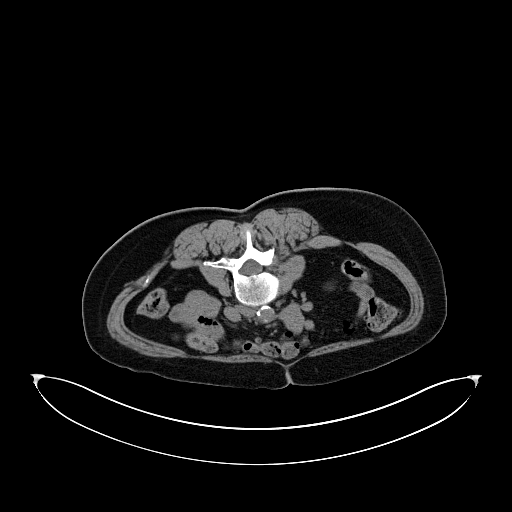
[im 5/45  lung]
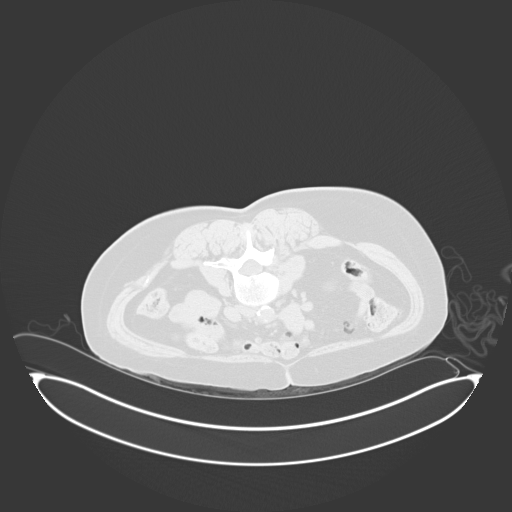
[im 10/45  lung]
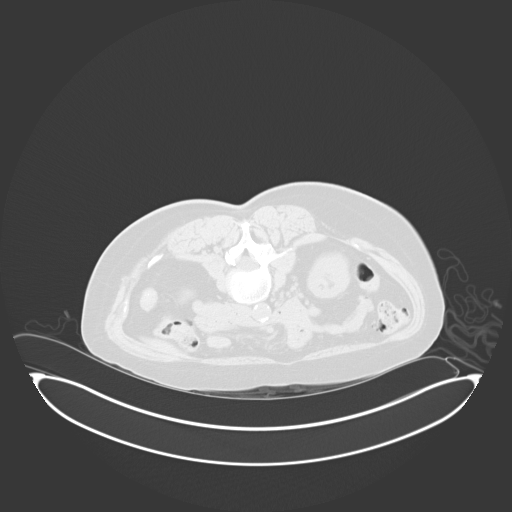
[im 15/45  lung]
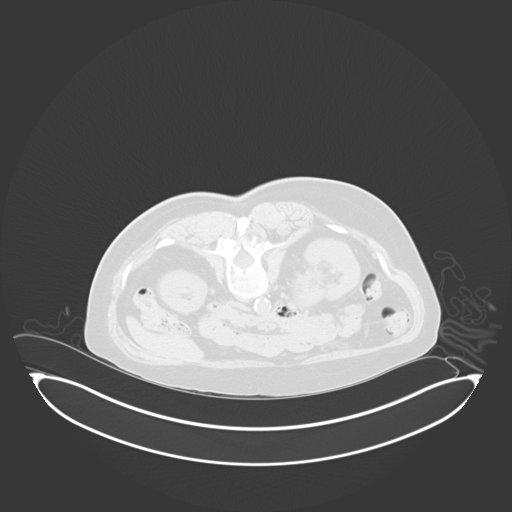
[im 20/45  lung]
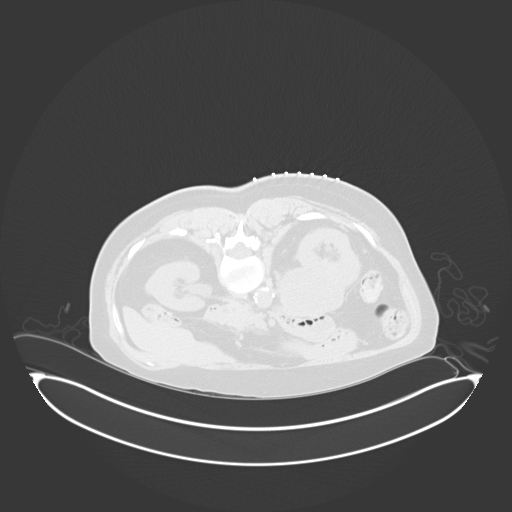
[im 21/45  mediastinal]
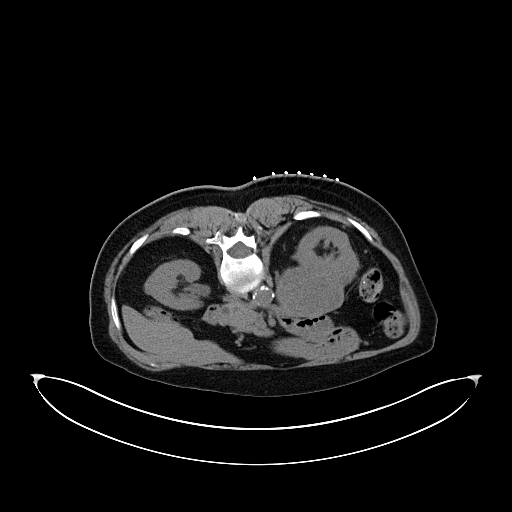
[im 21/45  lung]
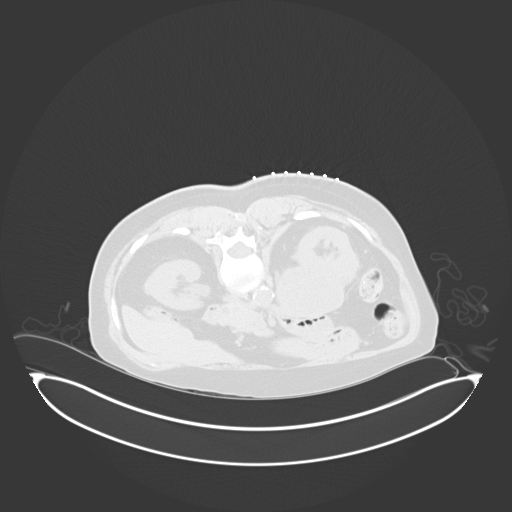
[im 23/45  lung]
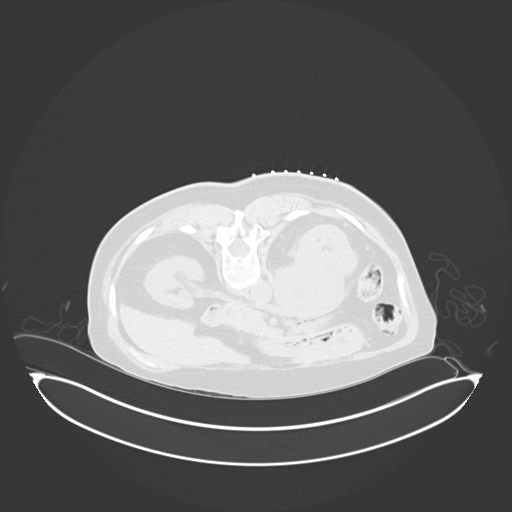
[im 25/45  lung]
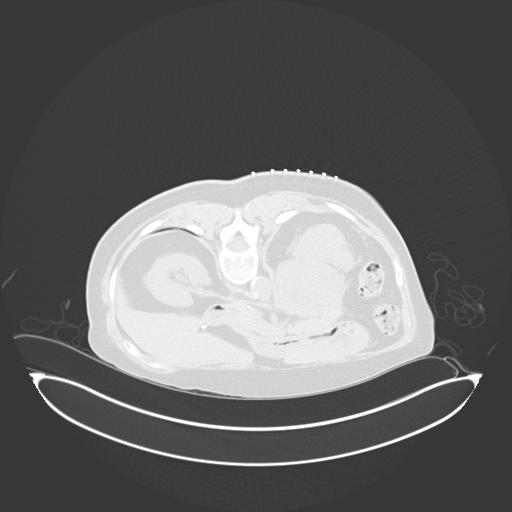
[im 30/45  lung]
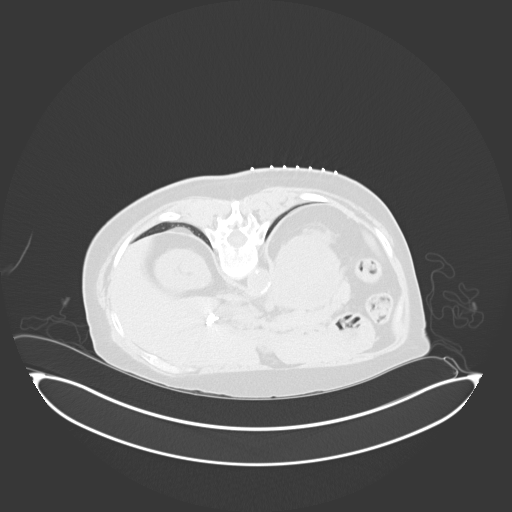
[im 35/45  mediastinal]
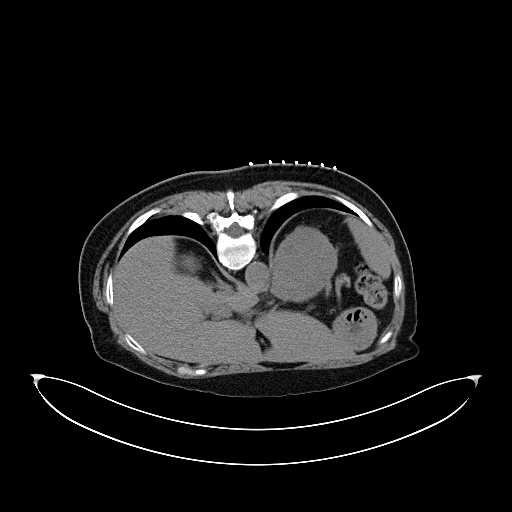
[im 35/45  lung]
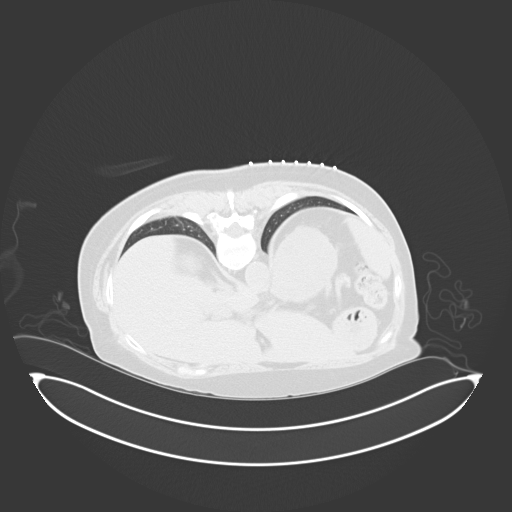
[im 40/45  lung]
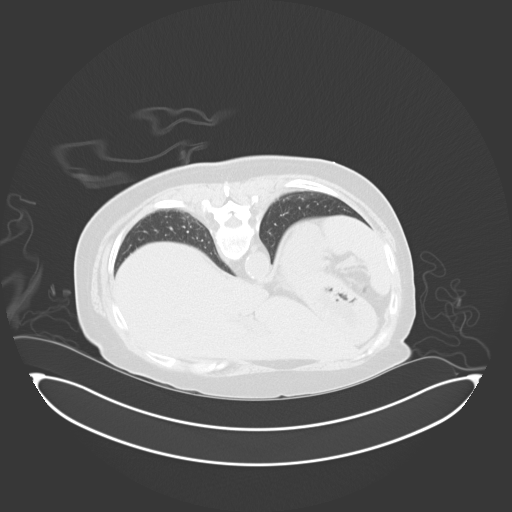

[10 of 32 positions shown; findings below may reference images not displayed]

EXAM:
CT BIOPSY

MEDICATIONS:
None.

ANESTHESIA/SEDATION:
Moderate (conscious) sedation was employed during this procedure. A
total of Versed 2 mg and Fentanyl 100 mcg was administered
intravenously.

Moderate Sedation Time: 12 minutes. The patient's level of
consciousness and vital signs were monitored continuously by
radiology nursing throughout the procedure under my direct
supervision.

FLUOROSCOPY TIME:  None.

COMPLICATIONS:
None immediate.

PROCEDURE:
Informed written consent was obtained from the patient after a
thorough discussion of the procedural risks, benefits and
alternatives. All questions were addressed. A timeout was performed
prior to the initiation of the procedure.

A planning axial CT scan was performed. The left adrenal mass was
successfully localized. The patient was placed in the left lateral
decubitus position to facilitate hypoinflation of the left lung and
allowed for a safe window without transgressing the pleura. Repeat
imaging was performed. A suitable skin entry site was selected and
marked. The overlying skin was sterilely prepped and draped in the
standard fashion using chlorhexidine skin prep. Local anesthesia was
attained by infiltration with 1% lidocaine. A small dermatotomy was
made. Under intermittent CT guidance, a 17 gauge introducer needle
was advanced and positioned in the margin of the mass. Multiple 18
gauge core biopsies were then coaxially obtained using the [employer]
automated biopsy device. Biopsy specimens were placed in formalin
and delivered to pathology for further analysis. As the introducer
needle was removed, the biopsy tract was embolized with a Gel-Foam
slurry.

Post biopsy axial CT imaging demonstrates no evidence of immediate
complication. The patient remained normotensive throughout the
procedure.
IMPRESSION: Technically successful CT-guided core biopsy of left adrenal mass.
